# Patient Record
Sex: Male | Born: 1990 | Race: Black or African American | Hispanic: No | Marital: Single | Smoking: Current every day smoker
Health system: Southern US, Community
[De-identification: ages and names within clinical notes are randomized; demographics above are authoritative.]

---
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
## 2011-02-16 NOTE — Consult Note (Signed)
NAMEARNOLD, Corey Rivas               ACCOUNT NO.:  [id number]   MEDICAL RECORD NO.:  [id number]          PATIENT TYPE:  EMS   LOCATION:  MAJO                         FACILITY:  [facility]   PHYSICIAN:  Leonides Grills, M.D.     DATE OF BIRTH:  1991-06-26   DATE OF CONSULTATION:  06/30/3005  DATE OF DISCHARGE:                                   CONSULTATION   CHIEF COMPLAINT:  Left leg pain.   HISTORY:  This is a 20 year old young gentleman who was playing football in  his backyard when he sustained the above injury.  He was brought to the ER  by his guardians.  Once they found it was fractured, they went home to pick  up some stuff and we are trying to contact them for he does need a closed  reduction under anesthesia.   PHYSICAL EXAMINATION:  His compartments are soft in the leg and foot.  Active and passive range of motion.  There is no significant deformity.  Palpable dorsalis pedes and posterior tibial pulses.  Sensation intact to  light touch over his entire foot.   X-rays show a spiral tibial fracture with significant displacement.   IMPRESSION:  Left spiral tibial fracture.   PLAN:  I explained to [facility] that, at this point, he needs a closed  reduction under anesthesia of his left tibial fracture with a long leg cast  application.  We will proceed with this once we contact his guardians.  He  will be admitted for 23 hour observation.      Leonides Grills, M.D.  Electronically Signed     PB/MEDQ  D:  06/20/2005  T:  06/21/2005  Job:  [id number]

## 2011-02-16 NOTE — Op Note (Signed)
NAMEAHMON, TOSI               ACCOUNT NO.:  [id number]   MEDICAL RECORD NO.:  [id number]          PATIENT TYPE:  INP   LOCATION:  1827                         FACILITY:  [facility]   PHYSICIAN:  Leonides Grills, M.D.     DATE OF BIRTH:  1990/11/12   DATE OF PROCEDURE:  06/21/2005  DATE OF DISCHARGE:                                 OPERATIVE REPORT   PREOPERATIVE DIAGNOSIS:  Left closed tibia fracture.   POSTOPERATIVE DIAGNOSIS:  Left closed tibia fracture.   OPERATION PERFORMED:  Closed reduction, left closed tibia fracture with long  leg cast application under general anesthesia.   SURGEON:  Leonides Grills, M.D.   ASSISTANT:  Lianne Cure, P.A.   ANESTHESIA:  General.   DISPOSITION:  Stable to PR.   INDICATIONS FOR PROCEDURE:  The patient is a 20 year old young gentleman who  today was playing football in his back yard and sustained the above injury.  He  has consented for the above procedure by his guardians.  All risks which  include nonunion, malunion, possibility of osteotomy and repeat closed  reductions were all explained, questions were encouraged and answered.   DESCRIPTION OF PROCEDURE:  The patient was brought to the operating room and  placed in supine position after adequate general endotracheal tube  anesthesia was administered.  We then performed a closed reduction and long  leg cast application that was bivalved.  Postreduction x-rays in the AP and  lateral plane showed an anatomic reduction.  The patient was stable to the  PR.      Leonides Grills, M.D.  Electronically Signed     PB/MEDQ  D:  06/21/2005  T:  06/21/2005  Job:  [id number]

## 2011-07-02 LAB — URINE CULTURE
Colony Count: NO GROWTH
Culture: NO GROWTH

## 2011-07-02 LAB — RAPID URINE DRUG SCREEN, HOSP PERFORMED
Amphetamines: NOT DETECTED
Barbiturates: NOT DETECTED
Benzodiazepines: NOT DETECTED
Cocaine: NOT DETECTED
Opiates: NOT DETECTED
Tetrahydrocannabinol: NOT DETECTED

## 2011-07-02 LAB — URINALYSIS, ROUTINE W REFLEX MICROSCOPIC
Bilirubin Urine: NEGATIVE
Glucose, UA: NEGATIVE
Hgb urine dipstick: NEGATIVE
Ketones, ur: 15 — AB
Leukocytes, UA: NEGATIVE
Nitrite: NEGATIVE
Protein, ur: >300 — AB
Specific Gravity, Urine: 1.031 — ABNORMAL HIGH
Urobilinogen, UA: 1
pH: 6

## 2011-07-02 LAB — URINE MICROSCOPIC-ADD ON

## 2013-07-02 ENCOUNTER — Emergency Department (HOSPITAL_COMMUNITY): Payer: Self-pay

## 2013-07-02 ENCOUNTER — Encounter (HOSPITAL_COMMUNITY): Payer: Self-pay | Admitting: Emergency Medicine

## 2013-07-02 ENCOUNTER — Emergency Department (HOSPITAL_COMMUNITY)
Admission: EM | Admit: 2013-07-02 | Discharge: 2013-07-02 | Disposition: A | Discharge status: Home / Self Care | Payer: Self-pay | Attending: Emergency Medicine | Admitting: Emergency Medicine

## 2013-07-02 DIAGNOSIS — R197 Diarrhea, unspecified: Secondary | ICD-10-CM | POA: Insufficient documentation

## 2013-07-02 DIAGNOSIS — R109 Unspecified abdominal pain: Secondary | ICD-10-CM | POA: Insufficient documentation

## 2013-07-02 DIAGNOSIS — R0789 Other chest pain: Secondary | ICD-10-CM | POA: Insufficient documentation

## 2013-07-02 DIAGNOSIS — R110 Nausea: Secondary | ICD-10-CM | POA: Insufficient documentation

## 2013-07-02 DIAGNOSIS — F17200 Nicotine dependence, unspecified, uncomplicated: Secondary | ICD-10-CM | POA: Insufficient documentation

## 2013-07-02 DIAGNOSIS — R51 Headache: Secondary | ICD-10-CM | POA: Insufficient documentation

## 2013-07-02 LAB — URINALYSIS, ROUTINE W REFLEX MICROSCOPIC
Ketones, ur: NEGATIVE mg/dL
Leukocytes, UA: NEGATIVE
Nitrite: NEGATIVE
Protein, ur: NEGATIVE mg/dL
Urobilinogen, UA: 0.2 mg/dL (ref 0.0–1.0)
pH: 7 (ref 5.0–8.0)

## 2013-07-02 LAB — COMPREHENSIVE METABOLIC PANEL
AST: 15 U/L (ref 0–37)
BUN: 8 mg/dL (ref 6–23)
Calcium: 9.6 mg/dL (ref 8.4–10.5)
Creatinine, Ser: 1.03 mg/dL (ref 0.50–1.35)
GFR calc Af Amer: >90 mL/min (ref >90)
Glucose, Bld: 101 mg/dL — ABNORMAL HIGH (ref 70–99)
Potassium: 3.4 mEq/L — ABNORMAL LOW (ref 3.5–5.1)
Sodium: 136 mEq/L (ref 135–145)

## 2013-07-02 LAB — CBC WITH DIFFERENTIAL/PLATELET
Basophils Absolute: 0 10*3/uL (ref 0.0–0.1)
Eosinophils Relative: 3 % (ref 0–5)
HCT: 41.6 % (ref 39.0–52.0)
Hemoglobin: 14.2 g/dL (ref 13.0–17.0)
MCHC: 34.1 g/dL (ref 30.0–36.0)
Monocytes Absolute: 0.8 10*3/uL (ref 0.1–1.0)
RBC: 4.71 MIL/uL (ref 4.22–5.81)
RDW: 12.5 % (ref 11.5–15.5)

## 2013-07-02 LAB — RAPID HIV SCREEN (WH-MAU): Rapid HIV Screen: NONREACTIVE

## 2013-07-02 LAB — LIPASE, BLOOD: Lipase: 15 U/L (ref 11–59)

## 2013-07-02 MED ORDER — DIPHENOXYLATE-ATROPINE 2.5-0.025 MG PO TABS: 1.0000 | ORAL_TABLET | Freq: Four times a day (QID) | ORAL | Status: DC | PRN

## 2013-07-02 MED ORDER — IBUPROFEN 800 MG PO TABS: 800.0000 mg | ORAL_TABLET | Freq: Three times a day (TID) | ORAL | Status: DC

## 2013-07-02 NOTE — ED Notes (Signed)
Bed: ZO10 Expected date:  Expected time:  Means of arrival:  Comments: EMS/22 yo with headache and nausea for 2 months

## 2013-07-02 NOTE — ED Provider Notes (Signed)
CSN: [id number]     Arrival date & time 07/02/13  4098 History   First MD Initiated Contact with Patient 07/02/13 [phone]     Chief Complaint  Patient presents with  . Headache   (Consider location/radiation/quality/duration/timing/severity/associated sxs/prior Treatment) HPI Comments: Patient presents to the ER for multiple complaints. Patient reports that he has been sick for one or 2 months. He has been experiencing intermittent mild to moderate headache, sharp chest pain, crampy abdominal pain, nausea without vomiting, diarrhea. Patient reports that he stepped on a used syringe some time before all of these symptoms began. He is concerned about the 2 are related. He has not had any fevers.  Patient is a 22 y.o. male presenting with headaches.  Headache Associated symptoms: abdominal pain, diarrhea and nausea   Associated symptoms: no fever and no vomiting     History reviewed. No pertinent past medical history. History reviewed. No pertinent past surgical history. Family History  Problem Relation Age of Onset  . Diabetes Other   . Cancer Other   . CAD Other   . Stroke Other    History  Substance Use Topics  . Smoking status: Current Every Day Smoker    Types: Cigarettes  . Smokeless tobacco: Not on file  . Alcohol Use: Yes    Review of Systems  Constitutional: Negative for fever.  Cardiovascular: Positive for chest pain.  Gastrointestinal: Positive for nausea, abdominal pain and diarrhea. Negative for vomiting.  Neurological: Positive for headaches.  All other systems reviewed and are negative.    Allergies  Review of patient's allergies indicates no known allergies.  Home Medications  No current outpatient prescriptions on file. BP 140/87  Pulse 89  Temp(Src) 98.7 F (37.1 C) (Oral)  Resp 16  SpO2 100% Physical Exam  Constitutional: He is oriented to person, place, and time. He appears well-developed and well-nourished. No distress.  HENT:  Head:  Normocephalic and atraumatic.  Right Ear: Hearing normal.  Left Ear: Hearing normal.  Nose: Nose normal.  Mouth/Throat: Oropharynx is clear and moist and mucous membranes are normal.  Eyes: Conjunctivae and EOM are normal. Pupils are equal, round, and reactive to light.  Neck: Normal range of motion. Neck supple.  Cardiovascular: Regular rhythm, S1 normal and S2 normal.  Exam reveals no gallop and no friction rub.   No murmur heard. Pulmonary/Chest: Effort normal and breath sounds normal. No respiratory distress. He exhibits no tenderness.  Abdominal: Soft. Normal appearance and bowel sounds are normal. There is no hepatosplenomegaly. There is no tenderness. There is no rebound, no guarding, no tenderness at McBurney's point and negative Murphy's sign. No hernia.  Musculoskeletal: Normal range of motion.  Neurological: He is alert and oriented to person, place, and time. He has normal strength. No cranial nerve deficit or sensory deficit. Coordination normal. GCS eye subscore is 4. GCS verbal subscore is 5. GCS motor subscore is 6.  Skin: Skin is warm, dry and intact. No rash noted. No cyanosis.  Psychiatric: He has a normal mood and affect. His speech is normal and behavior is normal. Thought content normal.    ED Course  Procedures (including critical care time) Labs Review Labs Reviewed  COMPREHENSIVE METABOLIC PANEL - Abnormal; Notable for the following:    Potassium 3.4 (*)    Glucose, Bld 101 (*)    Total Bilirubin 0.2 (*)    All other components within normal limits  CBC WITH DIFFERENTIAL  LIPASE, BLOOD  RAPID HIV SCREEN ([facility])  URINALYSIS, ROUTINE  W REFLEX MICROSCOPIC   Imaging Review Dg Abd Acute W/chest  07/02/2013   CLINICAL DATA:  Persistent diarrhea.  EXAM: ACUTE ABDOMEN SERIES (ABDOMEN 2 VIEW & CHEST 1 VIEW)  COMPARISON:  None.  FINDINGS: There is no evidence of dilated bowel loops or free intraperitoneal air. No radiopaque calculi or other significant radiographic  abnormality is seen. There is stool scattered throughout the nondistended colon. Heart size and mediastinal contours are within normal limits. Both lungs are clear.  IMPRESSION: Negative abdominal radiographs.  No acute cardiopulmonary disease.   Electronically Signed   By: Geanie Cooley   On: 07/02/2013 08:03    MDM  Diagnosis: 1. Headache 2. Diarrhea  Patient presents to the ER for evaluation of multiple vague complaints. Patient is complaining of a headache but has a normal neurologic exam. This has been going on for several months. In addition to this he has been experiencing diarrhea and abdominal cramping. This is not experiencing any abdominal pain currently, as benign and nontender abdominal exam. His lab work is entirely unremarkable. Patient reports that he stepped on a needle 2 months ago. I did perform HIV testing, he was nonreactive. He has normal LFTs, no evidence of jaundice. I did discuss with him the possibility of hepatitis transmission with needle stick. Hepatitis panel has been sent. Patient will need to follow up at the wound center for results as well as further evaluation for his ongoing chronic complaints.    Gilda Crease, MD 07/02/13 [phone]

## 2013-07-02 NOTE — ED Notes (Signed)
Per EMS pt is c/o headache and nausea  Pt states he has had loose stools for the past month   Pt states he did drink last night amt unknown  Pt states he stepped on a needle about 3 mths and about a month later is when his sxs started and the pt thinks all this is related to that

## 2013-07-02 NOTE — Progress Notes (Signed)
[facility] provided pt with a [employer] Orange Card application.  °

## 2013-07-03 LAB — HEPATITIS PANEL, ACUTE
HCV Ab: NEGATIVE
Hep A IgM: NEGATIVE
Hepatitis B Surface Ag: NEGATIVE

## 2014-01-05 ENCOUNTER — Emergency Department (HOSPITAL_COMMUNITY): Payer: Self-pay

## 2014-01-05 ENCOUNTER — Encounter (HOSPITAL_COMMUNITY): Payer: Self-pay | Admitting: Emergency Medicine

## 2014-01-05 ENCOUNTER — Emergency Department (HOSPITAL_COMMUNITY)
Admission: EM | Admit: 2014-01-05 | Discharge: 2014-01-05 | Disposition: A | Discharge status: Home / Self Care | Payer: Self-pay | Attending: Emergency Medicine | Admitting: Emergency Medicine

## 2014-01-05 DIAGNOSIS — Z23 Encounter for immunization: Secondary | ICD-10-CM | POA: Insufficient documentation

## 2014-01-05 DIAGNOSIS — M2550 Pain in unspecified joint: Principal | ICD-10-CM

## 2014-01-05 DIAGNOSIS — M2559 Pain in other specified joint: Secondary | ICD-10-CM | POA: Insufficient documentation

## 2014-01-05 DIAGNOSIS — M79646 Pain in unspecified finger(s): Secondary | ICD-10-CM

## 2014-01-05 DIAGNOSIS — F17200 Nicotine dependence, unspecified, uncomplicated: Secondary | ICD-10-CM | POA: Insufficient documentation

## 2014-01-05 DIAGNOSIS — G8911 Acute pain due to trauma: Secondary | ICD-10-CM | POA: Insufficient documentation

## 2014-01-05 MED ORDER — TETANUS-DIPHTH-ACELL PERTUSSIS 5-2.5-18.5 LF-MCG/0.5 IM SUSP
0.5000 mL | Freq: Once | INTRAMUSCULAR | Status: AC
  Administered 2014-01-05: 0.5 mL via INTRAMUSCULAR
  Filled 2014-01-05: qty 0.5

## 2014-01-05 NOTE — Discharge Instructions (Signed)
Musculoskeletal Pain °Musculoskeletal pain is muscle and boney aches and pains. These pains can occur in any part of the body. Your caregiver may treat you without knowing the cause of the pain. They may treat you if blood or urine tests, X-rays, and other tests were normal.  °CAUSES °There is often not a definite cause or reason for these pains. These pains may be caused by a type of germ (virus). The discomfort may also come from overuse. Overuse includes working out too hard when your body is not fit. Boney aches also come from weather changes. Bone is sensitive to atmospheric pressure changes. °HOME CARE INSTRUCTIONS  °· Ask when your test results will be ready. Make sure you get your test results. °· Only take over-the-counter or prescription medicines for pain, discomfort, or fever as directed by your caregiver. If you were given medications for your condition, do not drive, operate machinery or power tools, or sign legal documents for 24 hours. Do not drink alcohol. Do not take sleeping pills or other medications that may interfere with treatment. °· Continue all activities unless the activities cause more pain. When the pain lessens, slowly resume normal activities. Gradually increase the intensity and duration of the activities or exercise. °· During periods of severe pain, bed rest may be helpful. Lay or sit in any position that is comfortable. °· Putting ice on the injured area. °· Put ice in a bag. °· Place a towel between your skin and the bag. °· Leave the ice on for 15 to 20 minutes, 3 to 4 times a day. °· Follow up with your caregiver for continued problems and no reason can be found for the pain. If the pain becomes worse or does not go away, it may be necessary to repeat tests or do additional testing. Your caregiver may need to look further for a possible cause. °SEEK IMMEDIATE MEDICAL CARE IF: °· You have pain that is getting worse and is not relieved by medications. °· You develop chest pain  that is associated with shortness or breath, sweating, feeling sick to your stomach (nauseous), or throw up (vomit). °· Your pain becomes localized to the abdomen. °· You develop any new symptoms that seem different or that concern you. °MAKE SURE YOU:  °· Understand these instructions. °· Will watch your condition. °· Will get help right away if you are not doing well or get worse. °Document Released: 09/17/2005 Document Revised: 12/10/2011 Document Reviewed: 05/22/2013 °Exit[employer]® Patient Information ©2014 [employer]. ° °

## 2014-01-05 NOTE — ED Notes (Signed)
Pt reports laceration to left anterior little finger two weeks ago, it has healed but still having pain to finger. No swelling or redness noted.

## 2014-01-05 NOTE — ED Provider Notes (Signed)
CSN: [id number]     Arrival date & time 01/05/14  1647 History  This chart was scribed for non-physician practitioner, Junious SilkHannah Miriya Cloer, PA-C, working with Juliet RudeNathan R. Rubin PayorPickering, MD by Smiley HousemanFallon Davis, ED Scribe. This patient was seen in room TR08C/TR08C and the patient's care was started at 6:08 PM.    Chief Complaint  Patient presents with  . Finger Injury   The history is provided by the patient. No language interpreter was used.   HPI Comments: Corey Rivas is a 23 y.o. male who presents to the Emergency Department complaining of sharp pain to right fifth finger.  Pt reports he had a laceration to his left anterior fifth finger about 2 weeks ago, but it has healed properly.  He states he received the laceration from a nail at work.  Pt states even though it has healed completely he still has pain.  Pt reports pain is worse with movement.  Pt denies numbness and tingling.  Pt denies taking anything for pain PTA.  Pt reports he is right handed.  Pt is unsure if his tetanus is UTD.  Pt states he is otherwise healthy.     History reviewed. No pertinent past medical history. History reviewed. No pertinent past surgical history. Family History  Problem Relation Age of Onset  . Diabetes Other   . Cancer Other   . CAD Other   . Stroke Other    History  Substance Use Topics  . Smoking status: Current Every Day Smoker    Types: Cigarettes  . Smokeless tobacco: Not on file  . Alcohol Use: Yes    Review of Systems  Constitutional: Negative for fever and chills.  Respiratory: Negative for shortness of breath and wheezing.   Cardiovascular: Negative for chest pain.  Gastrointestinal: Negative for nausea, vomiting and abdominal pain.  Musculoskeletal: Positive for arthralgias (left fifth finger).  Skin: Negative for color change, rash and wound.  Neurological: Negative for weakness, numbness and headaches.  Psychiatric/Behavioral: Negative for behavioral problems and confusion.  All other  systems reviewed and are negative.   Allergies  Review of patient's allergies indicates no known allergies.  Home Medications   Triage Vitals: BP 155/88  Pulse 106  Temp(Src) 98 F (36.7 C) (Oral)  Resp 18  Ht 6\' 1"  (1.854 m)  Wt 195 lb 14.4 oz (88.86 kg)  BMI 25.85 kg/m2  SpO2 95%  Physical Exam  Nursing note and vitals reviewed. Constitutional: He is oriented to person, place, and time. He appears well-developed and well-nourished. No distress.  HENT:  Head: Normocephalic and atraumatic.  Right Ear: External ear normal.  Left Ear: External ear normal.  Nose: Nose normal.  Eyes: Conjunctivae are normal.  Neck: Normal range of motion. No tracheal deviation present.  Cardiovascular: Normal rate, regular rhythm and normal heart sounds.  Exam reveals no gallop and no friction rub.   No murmur heard. Pulmonary/Chest: Effort normal and breath sounds normal. No stridor. No respiratory distress. He has no wheezes. He has no rales. He exhibits no tenderness.  Abdominal: Soft. He exhibits no distension. There is no tenderness.  Musculoskeletal: Normal range of motion.  Well healed laceration to palmer aspect of left fifth finger.  No erythema, induration, or fluctuance.  Flexion and extension tested against resistance.  5 out 5 strength.  Cap refill less than 3 seconds.  Neurovascularly intact.  Compartment soft.    Neurological: He is alert and oriented to person, place, and time.  Skin: Skin is warm and dry.  No rash noted. He is not diaphoretic.  Psychiatric: He has a normal mood and affect. His behavior is normal. Judgment and thought content normal.    ED Course  Procedures (including critical care time) DIAGNOSTIC STUDIES: Oxygen Saturation is 95% on RA, adequate by my interpretation.    COORDINATION OF CARE: 6:17 PM-Informed pt his x-ray was normal.  Will order a tdap booster.  Will discharge with finger splint.  Patient informed of current plan of treatment and evaluation  and agrees with plan.    Imaging Review Dg Finger Little Right  01/05/2014   CLINICAL DATA:  Laceration right little finger 2 weeks ago. Pain with flexion.  EXAM: RIGHT LITTLE FINGER 2+V  COMPARISON:  None.  FINDINGS: No fracture, dislocation or radiopaque foreign body is identified. No soft tissue gas collection is seen.  IMPRESSION: Negative exam.   Electronically Signed   By: Drusilla Kanner M.D.   On: 01/05/2014 17:47   MDM   Final diagnoses:  Finger pain   Patient presents to ED for evaluation of left fifth finger pain after an injury 2 weeks ago. Patient's Tdap was updated. XR shows no acute pathology. Patient has full strength of finger. Neurovascularly intact. Compartment soft. No sign of infection. Return instructions given. Vital signs stable for discharge. Patient / Family / Caregiver informed of clinical course, understand medical decision-making process, and agree with plan.   I personally performed the services described in this documentation, which was scribed in my presence. The recorded information has been reviewed and is accurate.         Mora Bellman, PA-C 01/05/14 1825

## 2014-01-06 NOTE — ED Provider Notes (Signed)
Medical screening examination/treatment/procedure(s) were performed by non-physician practitioner and as supervising physician I was immediately available for consultation/collaboration.   EKG Interpretation None       Glennette Galster R. Christle Nolting, MD 01/06/14 0023 

## 2014-01-11 ENCOUNTER — Encounter (HOSPITAL_COMMUNITY): Payer: Self-pay | Admitting: Emergency Medicine

## 2014-01-11 ENCOUNTER — Emergency Department (HOSPITAL_COMMUNITY): Payer: Self-pay

## 2014-01-11 ENCOUNTER — Emergency Department (HOSPITAL_COMMUNITY)
Admission: EM | Admit: 2014-01-11 | Discharge: 2014-01-11 | Disposition: A | Discharge status: Home / Self Care | Payer: Self-pay | Attending: Emergency Medicine | Admitting: Emergency Medicine

## 2014-01-11 DIAGNOSIS — R5383 Other fatigue: Secondary | ICD-10-CM

## 2014-01-11 DIAGNOSIS — R51 Headache: Secondary | ICD-10-CM | POA: Insufficient documentation

## 2014-01-11 DIAGNOSIS — J309 Allergic rhinitis, unspecified: Secondary | ICD-10-CM | POA: Insufficient documentation

## 2014-01-11 DIAGNOSIS — J302 Other seasonal allergic rhinitis: Secondary | ICD-10-CM

## 2014-01-11 DIAGNOSIS — R0789 Other chest pain: Secondary | ICD-10-CM | POA: Insufficient documentation

## 2014-01-11 DIAGNOSIS — M79609 Pain in unspecified limb: Secondary | ICD-10-CM | POA: Insufficient documentation

## 2014-01-11 DIAGNOSIS — R5381 Other malaise: Secondary | ICD-10-CM | POA: Insufficient documentation

## 2014-01-11 DIAGNOSIS — F17200 Nicotine dependence, unspecified, uncomplicated: Secondary | ICD-10-CM | POA: Insufficient documentation

## 2014-01-11 LAB — CBC
HEMATOCRIT: 41.8 % (ref 39.0–52.0)
HEMOGLOBIN: 14.4 g/dL (ref 13.0–17.0)
MCH: 30.8 pg (ref 26.0–34.0)
MCHC: 34.4 g/dL (ref 30.0–36.0)
MCV: 89.5 fL (ref 78.0–100.0)
Platelets: 213 10*3/uL (ref 150–400)
RBC: 4.67 MIL/uL (ref 4.22–5.81)
RDW: 12.9 % (ref 11.5–15.5)
WBC: 5.7 10*3/uL (ref 4.0–10.5)

## 2014-01-11 LAB — BASIC METABOLIC PANEL
BUN: 10 mg/dL (ref 6–23)
CALCIUM: 10 mg/dL (ref 8.4–10.5)
CHLORIDE: 101 meq/L (ref 96–112)
CO2: 29 mEq/L (ref 19–32)
CREATININE: 1.14 mg/dL (ref 0.50–1.35)
Glucose, Bld: 76 mg/dL (ref 70–99)
POTASSIUM: 4.3 meq/L (ref 3.7–5.3)
SODIUM: 140 meq/L (ref 137–147)

## 2014-01-11 LAB — I-STAT TROPONIN, ED: TROPONIN I, POC: 0 ng/mL (ref 0.00–0.08)

## 2014-01-11 MED ORDER — CETIRIZINE-PSEUDOEPHEDRINE ER 5-120 MG PO TB12: 1.0000 | ORAL_TABLET | Freq: Every day | ORAL | Status: DC

## 2014-01-11 MED ORDER — ALBUTEROL SULFATE HFA 108 (90 BASE) MCG/ACT IN AERS
1.0000 | INHALATION_SPRAY | Freq: Four times a day (QID) | RESPIRATORY_TRACT | Status: DC | PRN
  Administered 2014-01-11: 1 via RESPIRATORY_TRACT
  Filled 2014-01-11: qty 6.7

## 2014-01-11 MED ORDER — IBUPROFEN 800 MG PO TABS
800.0000 mg | ORAL_TABLET | Freq: Once | ORAL | Status: AC
  Administered 2014-01-11: 800 mg via ORAL
  Filled 2014-01-11: qty 1

## 2014-01-11 NOTE — ED Notes (Signed)
Pt reports chest pain x 3 days and also headache. Denies cough and n/v/d. Positive for fatigue and lightheaded.

## 2014-01-11 NOTE — ED Provider Notes (Signed)
CSN: [id number]     Arrival date & time 01/11/14  1201 History   First MD Initiated Contact with Patient 01/11/14 1519     Chief Complaint  Patient presents with  . Chest Pain  . Leg Pain  . Arm Pain     (Consider location/radiation/quality/duration/timing/severity/associated sxs/prior Treatment) Patient is a 23 y.o. male presenting with chest pain, leg pain, and arm pain. The history is provided by the patient.  Chest Pain Pain location:  L chest Pain quality: sharp and tightness   Pain radiates to:  Does not radiate Pain radiates to the back: no   Pain severity:  Moderate Onset quality:  Sudden Duration:  1 hour (has been intermittent for the last 3 days) Timing:  Constant (constant for 1 hour today but intermittent for the last 3 days) Progression:  Resolved Chronicity:  Recurrent Context: breathing   Relieved by:  Nothing Worsened by:  Nothing tried Associated symptoms: no abdominal pain, no altered mental status, no anorexia, no cough, no shortness of breath, not vomiting and no weakness   Associated symptoms comment:  Sneezing, headaches, fatigue Risk factors: male sex and smoking   Risk factors: no coronary artery disease, no diabetes mellitus, no high cholesterol, no hypertension and not obese   Risk factors comment:  No family hx of MI Leg Pain Arm Pain Associated symptoms include chest pain. Pertinent negatives include no abdominal pain and no shortness of breath.    History reviewed. No pertinent past medical history. History reviewed. No pertinent past surgical history. Family History  Problem Relation Age of Onset  . Diabetes Other   . Cancer Other   . CAD Other   . Stroke Other    History  Substance Use Topics  . Smoking status: Current Every Day Smoker    Types: Cigarettes  . Smokeless tobacco: Not on file  . Alcohol Use: Yes    Review of Systems  Respiratory: Negative for cough and shortness of breath.   Cardiovascular: Positive for chest pain.   Gastrointestinal: Negative for vomiting, abdominal pain and anorexia.  Neurological: Negative for weakness.  All other systems reviewed and are negative.     Allergies  Review of patient's allergies indicates no known allergies.  Home Medications  No current outpatient prescriptions on file. BP 127/77  Pulse 75  Temp(Src) 97.5 F (36.4 C) (Oral)  Resp 25  SpO2 100% Physical Exam  Nursing note and vitals reviewed. Constitutional: He is oriented to person, place, and time. He appears well-developed and well-nourished. No distress.  HENT:  Head: Normocephalic and atraumatic.  Right Ear: Tympanic membrane and ear canal normal.  Left Ear: Tympanic membrane and ear canal normal.  Nose: Mucosal edema and rhinorrhea present. Right sinus exhibits no maxillary sinus tenderness and no frontal sinus tenderness. Left sinus exhibits no maxillary sinus tenderness and no frontal sinus tenderness.  Mouth/Throat: Oropharynx is clear and moist and mucous membranes are normal.  Eyes: Conjunctivae and EOM are normal. Pupils are equal, round, and reactive to light.  Neck: Normal range of motion. Neck supple.  Cardiovascular: Normal rate, regular rhythm and intact distal pulses.   No murmur heard. Pulmonary/Chest: Effort normal and breath sounds normal. No respiratory distress. He has no wheezes. He has no rales. He exhibits no tenderness.  Abdominal: Soft. He exhibits no distension. There is no tenderness. There is no rebound and no guarding.  Musculoskeletal: Normal range of motion. He exhibits no edema and no tenderness.  Neurological: He is alert and  oriented to person, place, and time.  Skin: Skin is warm and dry. No rash noted. No erythema.  Psychiatric: He has a normal mood and affect. His behavior is normal.    ED Course  Procedures (including critical care time) Labs Review Labs Reviewed  CBC  BASIC METABOLIC PANEL  Rosezena SensorI-STAT TROPOININ, ED   Imaging Review Dg Chest 2  View  01/11/2014   CLINICAL DATA:  Chest pain and cough.  EXAM: CHEST - 2 VIEW  COMPARISON:  DG ABD ACUTE W/CHEST dated 07/02/2013  FINDINGS: The heart size and mediastinal contours are within normal limits. There is no evidence of pulmonary edema, consolidation, pneumothorax, nodule or pleural fluid. The visualized skeletal structures are unremarkable.  IMPRESSION: No active disease.   Electronically Signed   By: Irish LackGlenn  Yamagata M.D.   On: 01/11/2014 16:34     EKG Interpretation   Date/Time:  Monday January 11 2014 12:07:18 EDT Ventricular Rate:  94 PR Interval:  140 QRS Duration: 82 QT Interval:  346 QTC Calculation: 432 R Axis:   84 Text Interpretation:  Normal sinus rhythm with sinus arrhythmia Normal ECG  No previous tracing Confirmed by Anitra LauthPLUNKETT  MD, Alphonzo LemmingsWHITNEY ([id number]) on  01/11/2014 3:19:12 PM      MDM   Final diagnoses:  Seasonal allergies  Chest pain, atypical    Patient he period of symptoms including headache, fatigue, sneezing, and intermittent chest pain that has been going on for about one month. He states this occurs almost every year around this time.  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  Pain has been intermittent in the chest over the last 3 days but is at rest and exertional.  He has no heart risk factors except for smoking and is PERC neg.  Pt is well appearing and currently not having CP.   His EKG is wnl and Labs are all wnl.  CXR wnl.   Troponin was checked appx 3 hours after chest pain and was neg and with Heart score of 1 only for smoking feel pt has been adequately ruled out for cardiac cause of sx.  Denies any infectious sx concerning for PNA and low suspicion for PE.  Feel pt's sx are related to allergies.  Will treat with albuterol and zyrtec.  5:05 PM CXR wnl.  Will d/c home.  Gwyneth SproutWhitney Amram Maya, MD 01/11/14 [phone]

## 2014-01-11 NOTE — Discharge Instructions (Signed)
Allergies  Allergies may happen from anything your body is sensitive to. This may be food, medicines, pollens, chemicals, and many other things. Food allergies can be severe and deadly.  HOME CARE  If you do not know what causes a reaction, keep a diary. Write down the foods you ate and the symptoms that followed. Avoid foods that cause reactions.  If you have red raised spots (hives) or a rash:  Take medicine as told by your doctor.  Use medicines for red raised spots and itching as needed.  Apply cold cloths (compresses) to the skin. Take a cool bath. Avoid hot baths or showers.  If you are severely allergic:  It is often necessary to go to the hospital after you have treated your reaction.  Wear your medical alert jewelry.  You and your family must learn how to give a allergy shot or use an allergy kit (anaphylaxis kit).  Always carry your allergy kit or shot with you. Use this medicine as told by your doctor if a severe reaction is occurring. GET HELP RIGHT AWAY IF:  You have trouble breathing or are making high-pitched whistling sounds (wheezing).  You have a tight feeling in your chest or throat.  You have a puffy (swollen) mouth.  You have red raised spots, puffiness (swelling), or itching all over your body.  You have had a severe reaction that was helped by your allergy kit or shot. The reaction can return once the medicine has worn off.  You think you are having a food allergy. Symptoms most often happen within 30 minutes of eating a food.  Your symptoms have not gone away within 2 days or are getting worse.  You have new symptoms.  You want to retest yourself with a food or drink you think causes an allergic reaction. Only do this under the care of a doctor. MAKE SURE YOU:   Understand these instructions.  Will watch your condition.  Will get help right away if you are not doing well or get worse. Document Released: 01/12/2013 Document Reviewed:  01/12/2013 [facility] Patient Information 2014 [place], [place].  Chest Pain (Nonspecific) It is often hard to give a specific diagnosis for the cause of chest pain. There is always a chance that your pain could be related to something serious, such as a heart attack or a blood clot in the lungs. You need to follow up with your caregiver for further evaluation. CAUSES   Heartburn.  Pneumonia or bronchitis.  Anxiety or stress.  Inflammation around your heart (pericarditis) or lung (pleuritis or pleurisy).  A blood clot in the lung.  A collapsed lung (pneumothorax). It can develop suddenly on its own (spontaneous pneumothorax) or from injury (trauma) to the chest.  Shingles infection (herpes zoster virus). The chest wall is composed of bones, muscles, and cartilage. Any of these can be the source of the pain.  The bones can be bruised by injury.  The muscles or cartilage can be strained by coughing or overwork.  The cartilage can be affected by inflammation and become sore (costochondritis). DIAGNOSIS  Lab tests or other studies, such as X-rays, electrocardiography, stress testing, or cardiac imaging, may be needed to find the cause of your pain.  TREATMENT   Treatment depends on what may be causing your chest pain. Treatment may include:  Acid blockers for heartburn.  Anti-inflammatory medicine.  Pain medicine for inflammatory conditions.  Antibiotics if an infection is present.  You may be advised to change lifestyle habits.  This includes stopping smoking and avoiding alcohol, caffeine, and chocolate.  You may be advised to keep your head raised (elevated) when sleeping. This reduces the chance of acid going backward from your stomach into your esophagus.  Most of the time, nonspecific chest pain will improve within 2 to 3 days with rest and mild pain medicine. HOME CARE INSTRUCTIONS   If antibiotics were prescribed, take your antibiotics as directed. Finish them even if  you start to feel better.  For the next few days, avoid physical activities that bring on chest pain. Continue physical activities as directed.  Do not smoke.  Avoid drinking alcohol.  Only take over-the-counter or prescription medicine for pain, discomfort, or fever as directed by your caregiver.  Follow your caregiver's suggestions for further testing if your chest pain does not go away.  Keep any follow-up appointments you made. If you do not go to an appointment, you could develop lasting (chronic) problems with pain. If there is any problem keeping an appointment, you must call to reschedule. SEEK MEDICAL CARE IF:   You think you are having problems from the medicine you are taking. Read your medicine instructions carefully.  Your chest pain does not go away, even after treatment.  You develop a rash with blisters on your chest. SEEK IMMEDIATE MEDICAL CARE IF:   You have increased chest pain or pain that spreads to your arm, neck, jaw, back, or abdomen.  You develop shortness of breath, an increasing cough, or you are coughing up blood.  You have severe back or abdominal pain, feel nauseous, or vomit.  You develop severe weakness, fainting, or chills.  You have a fever. THIS IS AN EMERGENCY. Do not wait to see if the pain will go away. Get medical help at once. Call your local emergency services (911 in U.S.). Do not drive yourself to the hospital. MAKE SURE YOU:   Understand these instructions.  Will watch your condition.  Will get help right away if you are not doing well or get worse. Document Released: 06/27/2005 Document Revised: 12/10/2011 Document Reviewed: 04/22/2008 [facility] Patient Information 2014 [place].

## 2014-01-11 NOTE — ED Notes (Addendum)
Per patient-cut right pinky finger with rusty nail one week ago. Received tetanus shot. Since then reports constant headache and occasional chest pain with no relief. Finger still causing pain. No c/o chest pain at this time. Also complains left arm is "cramping". Both arms are warm dry and not swollen with 2+ pulses. Resting comfortably. MD in to see patient.

## 2014-01-11 NOTE — Progress Notes (Signed)
[facility] provided pt with a list of primary care resources, highlighting [facility].  °

## 2014-01-16 ENCOUNTER — Encounter (HOSPITAL_COMMUNITY): Payer: Self-pay | Admitting: Emergency Medicine

## 2014-01-16 ENCOUNTER — Emergency Department (HOSPITAL_COMMUNITY): Payer: Self-pay

## 2014-01-16 ENCOUNTER — Emergency Department (HOSPITAL_COMMUNITY)
Admission: EM | Admit: 2014-01-16 | Discharge: 2014-01-16 | Disposition: A | Discharge status: Home / Self Care | Payer: Self-pay | Attending: Emergency Medicine | Admitting: Emergency Medicine

## 2014-01-16 DIAGNOSIS — Z79899 Other long term (current) drug therapy: Secondary | ICD-10-CM | POA: Insufficient documentation

## 2014-01-16 DIAGNOSIS — F17200 Nicotine dependence, unspecified, uncomplicated: Secondary | ICD-10-CM | POA: Insufficient documentation

## 2014-01-16 DIAGNOSIS — R1032 Left lower quadrant pain: Secondary | ICD-10-CM

## 2014-01-16 DIAGNOSIS — R109 Unspecified abdominal pain: Secondary | ICD-10-CM | POA: Insufficient documentation

## 2014-01-16 DIAGNOSIS — R51 Headache: Secondary | ICD-10-CM | POA: Insufficient documentation

## 2014-01-16 DIAGNOSIS — N489 Disorder of penis, unspecified: Secondary | ICD-10-CM | POA: Insufficient documentation

## 2014-01-16 LAB — URINALYSIS, ROUTINE W REFLEX MICROSCOPIC
Bilirubin Urine: NEGATIVE
Glucose, UA: NEGATIVE mg/dL
Hgb urine dipstick: NEGATIVE
Ketones, ur: NEGATIVE mg/dL
Leukocytes, UA: NEGATIVE
NITRITE: NEGATIVE
Protein, ur: NEGATIVE mg/dL
SPECIFIC GRAVITY, URINE: 1.019 (ref 1.005–1.030)
Urobilinogen, UA: 1 mg/dL (ref 0.0–1.0)
pH: 7 (ref 5.0–8.0)

## 2014-01-16 MED ORDER — IBUPROFEN 800 MG PO TABS: 800.0000 mg | ORAL_TABLET | Freq: Three times a day (TID) | ORAL | Status: DC

## 2014-01-16 NOTE — Discharge Instructions (Signed)
Your groin pain may be related to a hernia.  If you develop swelling, worsening pain or if you have any concerns, please return to ER for further care.    Hernia A hernia occurs when an internal organ pushes out through a weak spot in the abdominal wall. Hernias most commonly occur in the groin and around the navel. Hernias often can be pushed back into place (reduced). Most hernias tend to get worse over time. Some abdominal hernias can get stuck in the opening (irreducible or incarcerated hernia) and cannot be reduced. An irreducible abdominal hernia which is tightly squeezed into the opening is at risk for impaired blood supply (strangulated hernia). A strangulated hernia is a medical emergency. Because of the risk for an irreducible or strangulated hernia, surgery may be recommended to repair a hernia. CAUSES   Heavy lifting.  Prolonged coughing.  Straining to have a bowel movement.  A cut (incision) made during an abdominal surgery. HOME CARE INSTRUCTIONS   Bed rest is not required. You may continue your normal activities.  Avoid lifting more than 10 pounds (4.5 kg) or straining.  Cough gently. If you are a smoker it is best to stop. Even the best hernia repair can break down with the continual strain of coughing. Even if you do not have your hernia repaired, a cough will continue to aggravate the problem.  Do not wear anything tight over your hernia. Do not try to keep it in with an outside bandage or truss. These can damage abdominal contents if they are trapped within the hernia sac.  Eat a normal diet.  Avoid constipation. Straining over long periods of time will increase hernia size and encourage breakdown of repairs. If you cannot do this with diet alone, stool softeners may be used. SEEK IMMEDIATE MEDICAL CARE IF:   You have a fever.  You develop increasing abdominal pain.  You feel nauseous or vomit.  Your hernia is stuck outside the abdomen, looks discolored, feels  hard, or is tender.  You have any changes in your bowel habits or in the hernia that are unusual for you.  You have increased pain or swelling around the hernia.  You cannot push the hernia back in place by applying gentle pressure while lying down. MAKE SURE YOU:   Understand these instructions.  Will watch your condition.  Will get help right away if you are not doing well or get worse. Document Released: 09/17/2005 Document Revised: 12/10/2011 Document Reviewed: 05/06/2008 [facility] Patient Information 2014 [place].   Emergency Department Resource Guide 1) Find a Doctor and Pay Out of Pocket Although you won't have to find out who is covered by your insurance plan, it is a good idea to ask around and get recommendations. You will then need to call the office and see if the doctor you have chosen will accept you as a new patient and what types of options they offer for patients who are self-pay. Some doctors offer discounts or will set up payment plans for their patients who do not have insurance, but you will need to ask so you aren't surprised when you get to your appointment.  2) Contact Your Local Health Department Not all health departments have doctors that can see patients for sick visits, but many do, so it is worth a call to see if yours does. If you don't know where your local health department is, you can check in your phone book. The CDC also has a tool to help  you locate your state's health department, and many state websites also have listings of all of their local health departments.  3) Find a Walk-in Clinic If your illness is not likely to be very severe or complicated, you may want to try a walk in clinic. These are popping up all over the country in pharmacies, drugstores, and shopping centers. They're usually staffed by nurse practitioners or physician assistants that have been trained to treat common illnesses and complaints. They're usually fairly quick and  inexpensive. However, if you have serious medical issues or chronic medical problems, these are probably not your best option.  No Primary Care Doctor: - Call Health Connect at  [phone] - they can help you locate a primary care doctor that  accepts your insurance, provides certain services, etc. - Physician Referral Service- [phone]  Chronic Pain Problems: Organization         Address  Phone   Notes  [facility]  [phone] Patients need to be referred by their primary care doctor.   Medication Assistance: Organization         Address  Phone   Notes  [facility] [address] [place], [place] [id number] [phone] --Must be a resident of [facility] -- Must have NO insurance coverage whatsoever (no Medicaid/ Medicare, etc.) -- The pt. MUST have a primary care doctor that directs their care regularly and follows them in the community   [facility]ist  [phone]   [employer]  [phone]    Agencies that provide inexpensive medical care: Organization         Address  Phone   Notes  [facility]  [phone]   [facility]    [phone]) [phone]   [facility] [address] [place], [place] [address] [phone]   [facility] [address], [place] [phone]   [facility]    [phone]   [facility]    [phone]   [facility]  [address], [place] Phone:  [phone], Fax:  [phone] Hours of Operation:  9 am - 6 pm, M-F.  Also accepts Medicaid/Medicare and self-pay.  [facility]  [address], [place] Phone: [phone]) [phone], Fax: [phone]. Hours of Operation:  8:30 am - 5:30 pm, M-F.  Also accepts Medicaid and self-pay.  [facility] [place] [address], [place] Phone: [phone]   [facility] [address], [place], [place] [phone], Ext. [phone] Mondays & Thursdays: 7-9 AM.  First 15 patients are seen on a first come, first serve basis.    Medicaid-accepting [facility] Providers:  Organization         Address  Phone   Notes  [facility] [address], [place] [phone] Also accepts self-pay patients.  [facility] [address] [place], [place]  [phone]   [facility] [address], [place] [phone]   [facility] [address] [place] [address], [place] [phone]   Renaye RakersVeita Bland [address], [place]   [phone] Only accepts [place] Access [place] patients after they have their name applied to their card.   Self-Pay (no insurance) in [facility] [place]:  Organization  Address  Phone   Notes  Sickle Cell Patients, [facility] [place] [phone]   [facility] [place] [phone]   [facility]  [place], [address], [place] [phone]   [facility]/Dr. Osei-Bonsu  [address] [place] or [address], [place] [phone] Phone number for both [place] and [place] locations is the same.  [facility] [address] [place] [phone]   [facility] [address], [place] or [address] [phone]   [facility] [address], [place] [phone], phone; [phone], fax Sees patients 1st and 3rd Saturday of every month.  Must not qualify for public or private insurance (i.e. Medicaid, Medicare, [place] Health Choice, Veterans' Benefits)  Household income should be no more than 200% of the poverty level The clinic cannot treat you if you are pregnant or  think you are pregnant  Sexually transmitted diseases are not treated at the clinic.    Dental Care: Organization         Address  Phone  Notes  [facility] [place] [phone] Accepts children up to age 3 who are enrolled in [place] or [place]; pregnant women with a Medicaid card; and children who have applied for Medicaid or [place] Health Choice, but were declined, whose parents can pay a reduced fee at time of service.  [facility]  [address], [place] [phone] Accepts children up to age 81 who are enrolled in [place] or [place]; pregnant women with a Medicaid card; and children who have applied for Medicaid or [place] Health Choice, but were declined, whose parents can pay a reduced fee at time of service.  [facility]  [place] [phone] Patients are seen by appointment only. Walk-ins are not accepted. [place] will see patients 82 years of age and older. Monday - Tuesday (8am-5pm) Most Wednesdays (8:30-5pm) $30 per visit, cash only  [facility]  [address], [facility] [phone] Patients are seen by appointment only. Walk-ins are not accepted. [place] will see patients 17 years of age and older. One Wednesday Evening (Monthly: Volunteer Based).  $30 per visit, cash only  [place]  [phone] for adults; Children under age 57, call Graduate Pediatric Dentistry at [phone]. Children aged 44-14, please call [phone] to request a pediatric application.  Dental services are provided in all areas of dental care including fillings, crowns and bridges, complete and partial dentures, implants, gum treatment, root canals, and extractions. Preventive care is also provided. Treatment is provided to both adults  and children. Patients are selected via a lottery and there is often a waiting list.   [facility] [address] [place]  [phone] [email or web address]   [facility] [address] [place], [place] [phone], Ext. [phone] Second and Fourth Thursday of each month, opens at 6:30 AM; Clinic ends at 9 AM.  Patients are seen on a first-come first-served basis, and a limited number are seen during each clinic.   [facility]  [address] [place], [place], [place] [phone]   Eligibility Requirements °You must have lived in [place], [place], or [place] counties for at least the last three months. °  You cannot be eligible for state or federal sponsored healthcare insurance, including Veterans Administration, Medicaid, or Medicare. °  You generally cannot be eligible for healthcare insurance through your employer.  °  How to apply: °Eligibility screenings are held every Tuesday and Wednesday afternoon from 1:00 pm until 4:00 pm. You do not need an appointment for the interview!  °[facility] [address], [place], [place] [phone]   °[facility]  336-342-8273   °[facility]  336-703-3100   °[facility]  336-570-6415   ° °Behavioral Health Resources in the Community: °Intensive Outpatient Programs °Organization         Address  Phone  Notes  °[place] [facility] [address], [place], [place] [phone]   °[facility] [address], [place], [place] [phone]   °[facility] [address], [place], [place] ° 336-882-2125   °[facility] [address],  °[place], [place] 1-800-853-5163 or 336-641-4981   °Substance Abuse Resources °Organization         Address  Phone  Notes  °[facility]  336-882-2125   °[facility]  336-784-9470   °[facility]  336-285-9073     °[facility]  336-845-3988   °Residential & Outpatient Substance Abuse Program  1-800-659-3381   °Psychological Services °Organization         Address  Phone  Notes  °[facility]  336- [phone]   °[facility]  336- 378-7881   °[facility] [address], [place] 1-800-853-5163 or 336-641-4981   ° °Mobile Crisis Teams °Organization         Address  Phone  Notes  °[facility], Mobile Crisis Care Unit  1-877-626-1772   °Assertive °Psychotherapeutic Services ° [address] [place], [place] 336-834-9664   °Sharon DeEsch [address] °[place] 336-554-5454   ° °Self-Help/Support Groups °Organization         Address  Phone             Notes  °[facility] [place] - variety of support groups  336- 373-1402 Call for more information  °Narcotics Anonymous (NA), [facility] [address], °[place]  2 meetings at this location  ° °Residential Treatment Programs °Organization         Address  Phone  Notes  °[facility] [address],    °[place]  1-866-801-8205   °[facility] ° [address], [place], [place] [phone]   °[facility] [address], [place] 336-845-3988 Admissions: 8am-3pm M-F  °[facility] [address]    °[place], [place] 336-841-1104   °[facility] [address], [place], [place] 336-379-7146   °[facility] [address]  °[place], [place] 336-285-9073   °[facility] - Intensive Outpatient [address], [place], [place] 336-852-3033   °[facility] ([facility] [address]  °[place], [place] 1-877-615-2722 or 336-784-9470   °[facility] ([facility]) [address] [place], [place] 336-227-7417 Accepts Medicaid  °[facility] [address]  °[place] 1-800-659-3381 Substance Abuse/Addiction Treatment  ° °[place]  Behavioral Health Resources °  Organization         Address  Phone  Notes  °[facility]  (888) 581-9988   °Julie Brannon, PhD [address] [place], [place]   (336) 349-5553 or (336) 951-0000   °[facility]   [address] °[place], [place] (336) 349-4454   °[facility] [address], [place], [place] (336) 342-8316 Insurance/Medicaid/sponsorship through [facility]  °[facility] [address]                                    [place], [place] (336) 342-8316 Therapy/tele-psych/case  °[facility] [address]  ° [place], [place] (336) 349-2233    °Dr. Arfeen  (336) 349-4544   °[facility] [place]  [employer] [place] [facility] 1) [address],  °2) [address], [place] °3)  [address] [place] [address], [place] (336) 349-3220 °(336) 342-7768 ° °(336) 342-8140   °[place] Child Abuse Hotline (336) 342-1394 or (336) 342-3537 (After Hours)    ° ° ° °

## 2014-01-16 NOTE — ED Notes (Signed)
He is undergoing u/s as I write this.

## 2014-01-16 NOTE — ED Provider Notes (Signed)
CSN: [id number]     Arrival date & time 01/16/14  1315 History  This chart was scribed for non-physician practitioner, Fayrene HelperBowie Charlesa Ehle, PA-C,working with Rolan BuccoMelanie Belfi, MD, by Karle PlumberJennifer Tensley, ED Scribe.  This patient was seen in room WA24/WA24 and the patient's care was started at 2:09 PM.  Chief Complaint  Patient presents with  . Testicle Pain   The history is provided by the patient. No language interpreter was used.   HPI Comments:  Corey Rivas is a 23 y.o. male who presents to the Emergency Department complaining of severe aching left groin and testicle pain and tingling that started about over one year ago. Pt states the pain has been worsening and lasts for hours at a time. He states the pain is primarily in the left testicle but it sometimes radiates to the right. He report hardness of the testicles and tingling to the tip of the scrotum. He reports the testicle is severely tender to touch. Pt is unsure of penile discharge. He reports an associated daily HA for the past year. Pt denies taking anything for pain. He states he does a lot of heavy lifting because of his job. He reports an unassociated, itching hemorrhoid for the last 4-5 days that is unchanged and not bleeding. He does not have a PCP. He denies swelling of the groin or penis, difficulty urinating, dysuria, fever and chills. Pt expresses concern and worry of testicular cancer, epididymitis, or other concerning medical conditions.  History reviewed. No pertinent past medical history. History reviewed. No pertinent past surgical history. Family History  Problem Relation Age of Onset  . Diabetes Other   . Cancer Other   . CAD Other   . Stroke Other    History  Substance Use Topics  . Smoking status: Current Every Day Smoker    Types: Cigarettes  . Smokeless tobacco: Not on file  . Alcohol Use: Yes    Review of Systems  Constitutional: Negative for fever and chills.  Genitourinary: Positive for penile pain (tingling) and  testicular pain. Negative for dysuria, penile swelling, scrotal swelling and difficulty urinating.  Neurological: Positive for headaches.    Allergies  Review of patient's allergies indicates no known allergies.  Home Medications   Prior to Admission medications   Medication Sig Start Date End Date Taking? Authorizing Provider  albuterol (PROVENTIL HFA;VENTOLIN HFA) 108 (90 BASE) MCG/ACT inhaler Inhale 1 puff into the lungs every 6 (six) hours as needed for wheezing or shortness of breath.   Yes Historical Provider, MD  cetirizine-pseudoephedrine (ZYRTEC-D) 5-120 MG per tablet Take 1 tablet by mouth daily. 01/11/14   Gwyneth SproutWhitney Plunkett, MD   Triage Vitals: BP 132/90  Pulse 90  Temp(Src) 98.9 F (37.2 C) (Oral)  Resp 16  SpO2 100% Physical Exam  Nursing note and vitals reviewed. Constitutional: He is oriented to person, place, and time. He appears well-developed and well-nourished.  HENT:  Head: Normocephalic and atraumatic.  Eyes: EOM are normal.  Neck: Normal range of motion.  Cardiovascular: Normal rate, regular rhythm and normal heart sounds.  Exam reveals no gallop and no friction rub.   No murmur heard. Pulmonary/Chest: Effort normal and breath sounds normal. No respiratory distress. He has no wheezes. He has no rales.  Abdominal: Soft. Hernia confirmed negative in the right inguinal area and confirmed negative in the left inguinal area.  Genitourinary: Testes normal and penis normal. Cremasteric reflex is present. Right testis shows no tenderness. Left testis shows no tenderness. Circumcised.  Bilateral testicle  with normal lies. Normal scrotum. No rash or swelling.  Musculoskeletal: Normal range of motion.  Lymphadenopathy:       Right: No inguinal adenopathy present.       Left: No inguinal adenopathy present.  Neurological: He is alert and oriented to person, place, and time.  Skin: Skin is warm and dry.  Psychiatric: He has a normal mood and affect. His behavior is  normal.    ED Course  Procedures (including critical care time) DIAGNOSTIC STUDIES: Oxygen Saturation is 100% on RA, normal by my interpretation.   COORDINATION OF CARE: 2:20 PM- Will perform ultrasound of testicles. Pt verbalizes understanding and agrees to plan.  3:43 PM Given that pt was concern of epidydimitis and cancern, [place] ordered and result was negative.  Reassurance given. Ibuprofen for pain, resources provide and close f/u instruction given.  I suspect pt my have risk of developing inguinal hernia from his heavy strenuous job.  CCS referral given.   Medications - No data to display  Labs Review Labs Reviewed  URINALYSIS, ROUTINE W REFLEX MICROSCOPIC    Imaging Review US Scrotum  01/16/2014   CLINICAL DATA:  Left-sided testicular pain.  EXAM: SCROTAL ULTRASOUND  DOPPLER ULTRASOUND OF THE TESTICLES  TECHNIQUE: Complete ultrasound examination of the testicles, epididymis, and other scrotal structures was performed. Color and spectral Doppler ultrasound were also utilized to evaluate blood flow to the testicles.  COMPARISON:  None.  FINDINGS: Right testicle  Measurements: 3.8 x 1.9 x 2.8 cm. Normal in morphology. Normal color Doppler signal.  Left testicle  Measurements: 4.2 x 2.0 x 3.2 cm. Normal in morphology. Normal color Doppler signal.  Right epididymis:  Normal in size and appearance.  Left epididymis:  Normal in size and appearance.  Hydrocele: Small volume scrotal fluid bilaterally. This is likely physiologic.  Varicocele:  Absent  Pulsed Doppler interrogation of both testes demonstrates low resistance arterial and venous waveforms bilaterally.  IMPRESSION: No evidence of testicular torsion, orchitis/ epididymitis, or other explanation for left-sided pain.   Electronically Signed   By: Jeronimo Greaves M.D.   On: 01/16/2014 15:21   [place] Art/ven Flow Abd Pelv Doppler  01/16/2014   CLINICAL DATA:  Left-sided testicular pain.  EXAM: SCROTAL ULTRASOUND  DOPPLER ULTRASOUND OF THE  TESTICLES  TECHNIQUE: Complete ultrasound examination of the testicles, epididymis, and other scrotal structures was performed. Color and spectral Doppler ultrasound were also utilized to evaluate blood flow to the testicles.  COMPARISON:  None.  FINDINGS: Right testicle  Measurements: 3.8 x 1.9 x 2.8 cm. Normal in morphology. Normal color Doppler signal.  Left testicle  Measurements: 4.2 x 2.0 x 3.2 cm. Normal in morphology. Normal color Doppler signal.  Right epididymis:  Normal in size and appearance.  Left epididymis:  Normal in size and appearance.  Hydrocele: Small volume scrotal fluid bilaterally. This is likely physiologic.  Varicocele:  Absent  Pulsed Doppler interrogation of both testes demonstrates low resistance arterial and venous waveforms bilaterally.  IMPRESSION: No evidence of testicular torsion, orchitis/ epididymitis, or other explanation for left-sided pain.   Electronically Signed   By: Jeronimo Greaves M.D.   On: 01/16/2014 15:21     EKG Interpretation None      MDM   Final diagnoses:  Left groin pain    BP 132/90  Pulse 90  Temp(Src) 98.9 F (37.2 C) (Oral)  Resp 16  SpO2 100%  I have reviewed nursing notes and vital signs. I personally reviewed the imaging tests through PACS system  I reviewed available ER/hospitalization records thought the EMR   I personally performed the services described in this documentation, which was scribed in my presence. The recorded information has been reviewed and is accurate.    Fayrene HelperBowie Mansur Patti, PA-C 01/16/14 [phone]

## 2014-01-16 NOTE — ED Notes (Signed)
Pt states that he has been having lt sided testicular pain x 3 wks.  States that it is not swollen but it is hard.  Denies dysuria.

## 2014-01-17 NOTE — ED Provider Notes (Signed)
Medical screening examination/treatment/procedure(s) were performed by non-physician practitioner and as supervising physician I was immediately available for consultation/collaboration.   EKG Interpretation None        Aubriee Szeto W. Clemon Devaul, MD 01/17/14 1534 

## 2014-12-08 ENCOUNTER — Emergency Department (HOSPITAL_COMMUNITY): Payer: Self-pay

## 2014-12-08 ENCOUNTER — Emergency Department (HOSPITAL_COMMUNITY)
Admission: EM | Admit: 2014-12-08 | Discharge: 2014-12-08 | Disposition: A | Discharge status: Home / Self Care | Payer: Self-pay | Attending: Emergency Medicine | Admitting: Emergency Medicine

## 2014-12-08 ENCOUNTER — Encounter (HOSPITAL_COMMUNITY): Payer: Self-pay | Admitting: Nurse Practitioner

## 2014-12-08 DIAGNOSIS — S99922A Unspecified injury of left foot, initial encounter: Secondary | ICD-10-CM

## 2014-12-08 DIAGNOSIS — S90512A Abrasion, left ankle, initial encounter: Secondary | ICD-10-CM | POA: Insufficient documentation

## 2014-12-08 DIAGNOSIS — Y939 Activity, unspecified: Secondary | ICD-10-CM | POA: Insufficient documentation

## 2014-12-08 DIAGNOSIS — W208XXA Other cause of strike by thrown, projected or falling object, initial encounter: Secondary | ICD-10-CM | POA: Insufficient documentation

## 2014-12-08 DIAGNOSIS — Z79899 Other long term (current) drug therapy: Secondary | ICD-10-CM | POA: Insufficient documentation

## 2014-12-08 DIAGNOSIS — Y999 Unspecified external cause status: Secondary | ICD-10-CM | POA: Insufficient documentation

## 2014-12-08 DIAGNOSIS — Z720 Tobacco use: Secondary | ICD-10-CM | POA: Insufficient documentation

## 2014-12-08 DIAGNOSIS — Y929 Unspecified place or not applicable: Secondary | ICD-10-CM | POA: Insufficient documentation

## 2014-12-08 MED ORDER — HYDROCODONE-ACETAMINOPHEN 5-325 MG PO TABS
1.0000 | ORAL_TABLET | Freq: Once | ORAL | Status: AC
  Administered 2014-12-08: 1 via ORAL
  Filled 2014-12-08: qty 1

## 2014-12-08 NOTE — ED Notes (Addendum)
Pt reports a trailer fell onto his L foot this afternoon. hes had pain and swelling in the foot since. He took ibuprofen with minimal relief of pain

## 2014-12-08 NOTE — Discharge Instructions (Signed)
Return to the emergency room with worsening of symptoms, new symptoms or with symptoms that are concerning, especially fevers, numbness, tingling, increased swelling, weakness, redness, unable to move foot. Crutches for comfort. He does not need to use after 24-48 hours. RICE: Rest, Ice (three cycles of 20 mins on, 20mins off at least twice a day), compression/brace, elevation. Heating pad works well for back pain. Ibuprofen 400mg  (2 tablets 200mg ) every 5-6 hours for 3-5 days. Follow up with PCP if symptoms worsen or are persistent. Read below information and follow recommendations. Crush Injury, Fingers or Toes A crush injury to the fingers or toes means the tissues have been damaged by being squeezed (compressed). There will be bleeding into the tissues and swelling. Often, blood will collect under the skin. When this happens, the skin on the finger often dies and may slough off (shed) 1 week to 10 days later. Usually, new skin is growing underneath. If the injury has been too severe and the tissue does not survive, the damaged tissue may begin to turn black over several days.  Wounds which occur because of the crushing may be stitched (sutured) shut. However, crush injuries are more likely to become infected than other injuries.These wounds may not be closed as tightly as other types of cuts to prevent infection. Nails involved are often lost. These usually grow back over several weeks.  DIAGNOSIS X-rays may be taken to see if there is any injury to the bones. TREATMENT Broken bones (fractures) may be treated with splinting, depending on the fracture. Often, no treatment is required for fractures of the last bone in the fingers or toes. HOME CARE INSTRUCTIONS   The crushed part should be raised (elevated) above the heart or center of the chest as much as possible for the first several days or as directed. This helps with pain and lessens swelling. Less swelling increases the chances that the  crushed part will survive.  Put ice on the injured area.  Put ice in a plastic bag.  Place a towel between your skin and the bag.  Leave the ice on for 15-20 minutes, 03-04 times a day for the first 2 days.  Only take over-the-counter or prescription medicines for pain, discomfort, or fever as directed by your caregiver.  Use your injured part only as directed.  Change your bandages (dressings) as directed.  Keep all follow-up appointments as directed by your caregiver. Not keeping your appointment could result in a chronic or permanent injury, pain, and disability. If there is any problem keeping the appointment, you must call to reschedule. SEEK IMMEDIATE MEDICAL CARE IF:   There is redness, swelling, or increasing pain in the wound area.  Pus is coming from the wound.  You have a fever.  You notice a bad smell coming from the wound or dressing.  The edges of the wound do not stay together after the sutures have been removed.  You are unable to move the injured finger or toe. MAKE SURE YOU:   Understand these instructions.  Will watch your condition.  Will get help right away if you are not doing well or get worse. Document Released: 09/17/2005 Document Revised: 12/10/2011 Document Reviewed: 02/02/2011 [facility] Patient Information 2015 [employer] [place]. This information is not intended to replace advice given to you by your health care provider. Make sure you discuss any questions you have with your health care provider.

## 2014-12-08 NOTE — Progress Notes (Signed)
Orthopedic Tech Progress Note Patient Details:  Corey RumpMarquel Rivas 07/10/1991 [id number]  Ortho Devices Type of Ortho Device: Crutches Ortho Device/Splint Interventions: Application   Haskell Flirtewsome, Quadarius Henton M 12/08/2014, 11:32 PM

## 2014-12-08 NOTE — ED Notes (Signed)
Pt made aware to return if symptoms worsen or if any life threatening symptoms occur.   

## 2014-12-08 NOTE — ED Notes (Signed)
Ice provided to pt for foot per PA

## 2014-12-08 NOTE — ED Provider Notes (Signed)
CSN: [id number]     Arrival date & time 12/08/14  2124 History  This chart was scribed for non-physician practitioner working with Mirian MoMatthew Gentry, MD by Richarda Overlieichard Holland, ED Scribe. This patient was seen in room TR04C/TR04C and the patient's care was started at 10:17 PM.   Chief Complaint  Patient presents with  . Foot Injury   The history is provided by the patient. No language interpreter was used.   HPI Comments: Corey RumpMarquel Rivas is a 24 y.o. male who presents to the Emergency Department complaining of a foot injury that occurred around 2PM today. Pt states that an equipment trailer fell on his left foot and it had to be lifted off. He complains of moderate left foot pain at this time and states his worst pain is located on the top portion of his foot. Pt states that moving his foot, standing or twisting worsens his pain. He rates his left foot pain as a 8/10 at this time. He states that he took ibuprofen about 8 hours ago which provided him some minimal pain relief. Pt reports no prior left foot injuries but reports left leg surgery for a fracture. Pt reports NKDA. He denies toe pain, nausea, vomiting, fevers and chills.  No numbness, tingling.  History reviewed. No pertinent past medical history. History reviewed. No pertinent past surgical history. Family History  Problem Relation Age of Onset  . Diabetes Other   . Cancer Other   . CAD Other   . Stroke Other    History  Substance Use Topics  . Smoking status: Current Every Day Smoker    Types: Cigarettes  . Smokeless tobacco: Not on file  . Alcohol Use: Yes    Review of Systems  Constitutional: Negative for fever and chills.  Gastrointestinal: Negative for nausea and vomiting.  Musculoskeletal: Positive for joint swelling and arthralgias.    Allergies  Review of patient's allergies indicates no known allergies.  Home Medications   Prior to Admission medications   Medication Sig Start Date End Date Taking? Authorizing Provider   albuterol (PROVENTIL HFA;VENTOLIN HFA) 108 (90 BASE) MCG/ACT inhaler Inhale 1 puff into the lungs every 6 (six) hours as needed for wheezing or shortness of breath.    Historical Provider, MD  cetirizine-pseudoephedrine (ZYRTEC-D) 5-120 MG per tablet Take 1 tablet by mouth daily. 01/11/14   Gwyneth SproutWhitney Plunkett, MD  ibuprofen (ADVIL,MOTRIN) 800 MG tablet Take 1 tablet (800 mg total) by mouth 3 (three) times daily. 01/16/14   Fayrene HelperBowie Tran, PA-C   BP 139/84 mmHg  Pulse 82  Temp(Src) 97.6 F (36.4 C) (Oral)  Resp 19  SpO2 100% Physical Exam  Constitutional: He is oriented to person, place, and time. He appears well-developed and well-nourished. No distress.  HENT:  Head: Normocephalic and atraumatic.  Eyes: Conjunctivae are normal. Right eye exhibits no discharge. Left eye exhibits no discharge.  Cardiovascular: Normal rate, regular rhythm and normal heart sounds.   Pulmonary/Chest: Effort normal and breath sounds normal. No respiratory distress. He has no wheezes. He has no rales.  Musculoskeletal:  Less than 3 seconds cap refill. 2+ pedal pulses equal bilaterally. Swelling to left dorsum of foot with superficial abrasion distal to ankle joint. No proximal fibula head tenderness on left. ROM of left ankle with pain. 5/5 Strength and sensation intact.  Neurological: He is alert and oriented to person, place, and time. Coordination normal.  Skin: Skin is warm and dry. He is not diaphoretic.  Psychiatric: He has a normal mood and affect.  His behavior is normal.  Nursing note and vitals reviewed.   ED Course  Procedures   DIAGNOSTIC STUDIES: Oxygen Saturation is 100% on RA, normal by my interpretation.    COORDINATION OF CARE: 10:26 PM Discussed treatment plan with pt at bedside and pt agreed to plan.   Labs Review Labs Reviewed - No data to display  Imaging Review Dg Foot Complete Left  12/08/2014   CLINICAL DATA:  Injury to the left foot today. On equipment trailer fell onto the foot.  Pain all over the foot. Swelling and difficulty bearing weight.  EXAM: LEFT FOOT - COMPLETE 3+ VIEW  COMPARISON:  None.  FINDINGS: There is no evidence of fracture or dislocation. 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 There is no evidence of arthropathy or other focal bone abnormality. Soft tissues are unremarkable.  IMPRESSION: Negative.   Electronically Signed   By: Burman Nieves M.D.   On: 12/08/2014 22:26     EKG Interpretation None      MDM   Final diagnoses:  Foot injury, left, initial encounter   Patient presenting after foot injury today. He states a trailer landed on his foot. Patient with significant pain. VSS. Foot neurovascularly intact. No open wound. Full range of motion. He does have mild swelling. Patient states he can't walk on it due to the pain. X-ray without acute osseous abnormality. Patient's pain managed in the ED. He was given a Ace bandage as well as crutches for symptomatic relief. He is to follow-up with the wellness center for persistent pain. Discussed rice protocol as well as ibuprofen. He showed no acute distressed and stable for discharge.  Discussed return precautions with patient. Discussed all results and patient verbalizes understanding and agrees with plan.  I personally performed the services described in this documentation, which was scribed in my presence. The recorded information has been reviewed and is accurate.   Oswaldo Conroy, PA-C 12/09/14 [id number]  Mirian Mo, MD 12/13/14 [phone]

## 2016-02-21 IMAGING — US US SCROTUM
1 series · 14 of 25 positions shown · non-contrast
Comparison: None.

CLINICAL DATA: Left-sided testicular pain.

EXAM:
SCROTAL ULTRASOUND
DOPPLER ULTRASOUND OF THE TESTICLES
TECHNIQUE: Complete ultrasound examination of the testicles, epididymis, and
other scrotal structures was performed. Color and spectral Doppler
ultrasound were also utilized to evaluate blood flow to the
testicles.

[Series 1: us scrotum · 0.04mm/px · 14 of 60 slices shown]
[im 1/60]
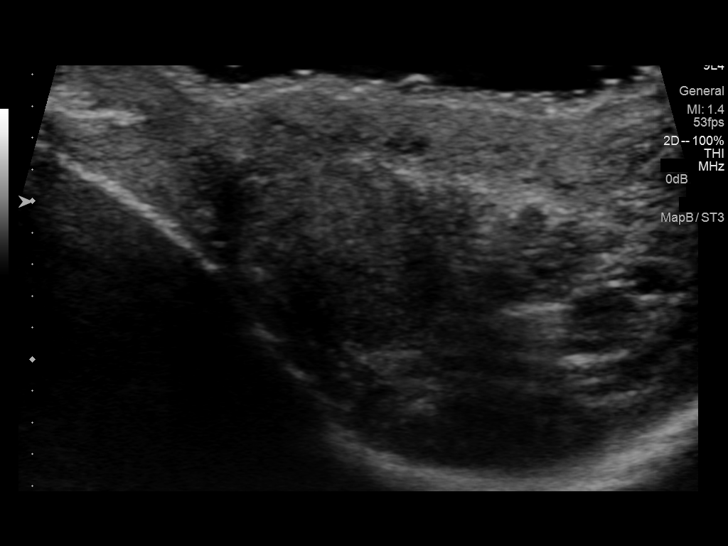
[im 5/60]
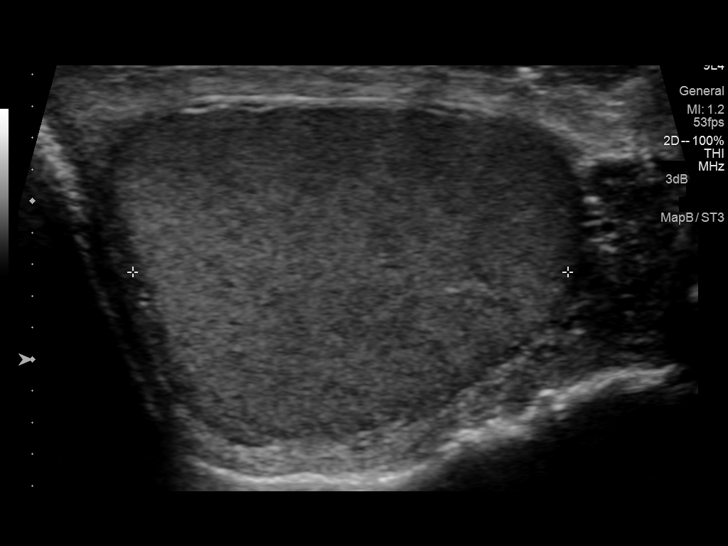
[im 10/60]
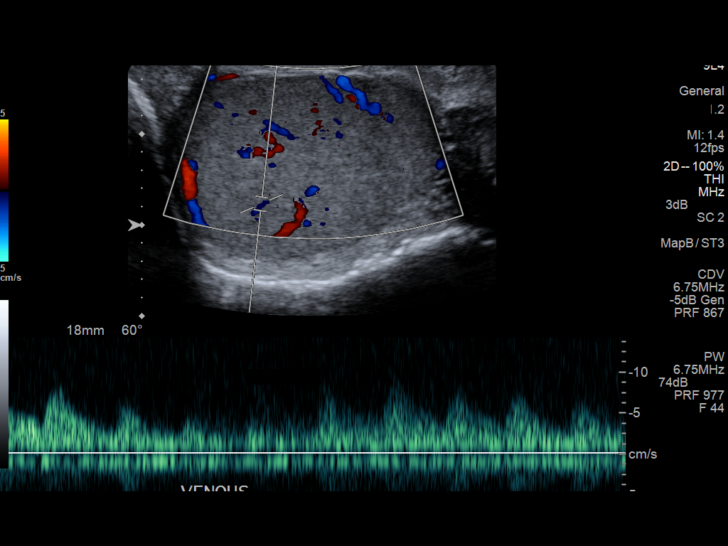
[im 15/60]
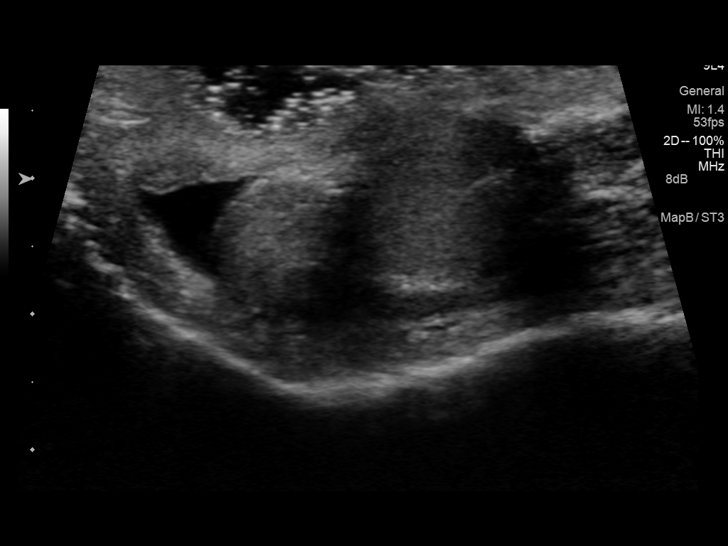
[im 20/60]
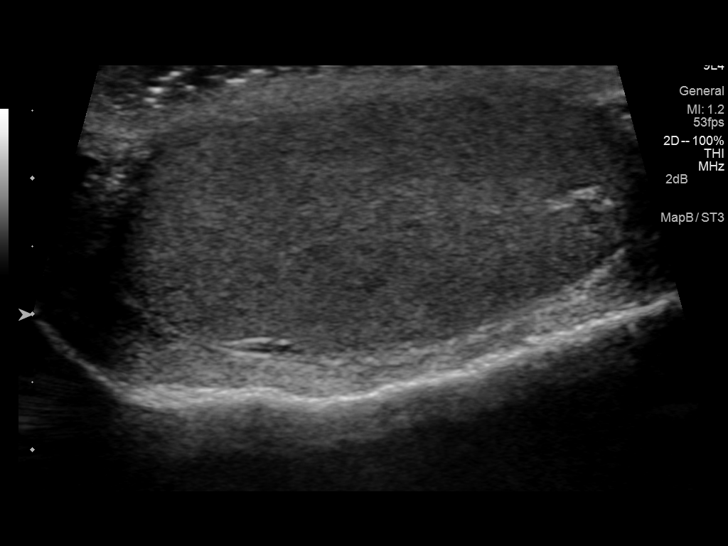
[im 23/60]
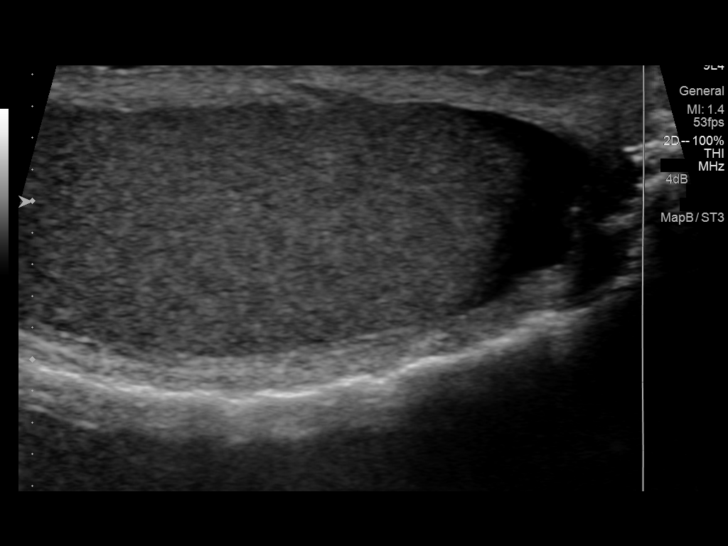
[im 28/60]
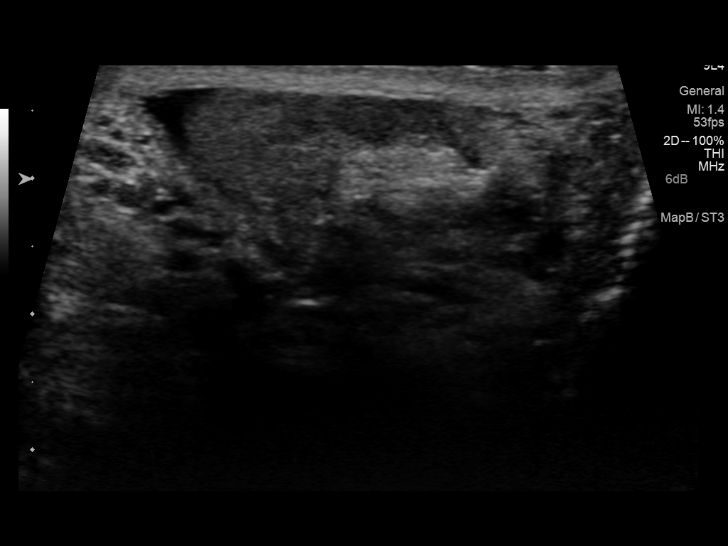
[im 32/60]
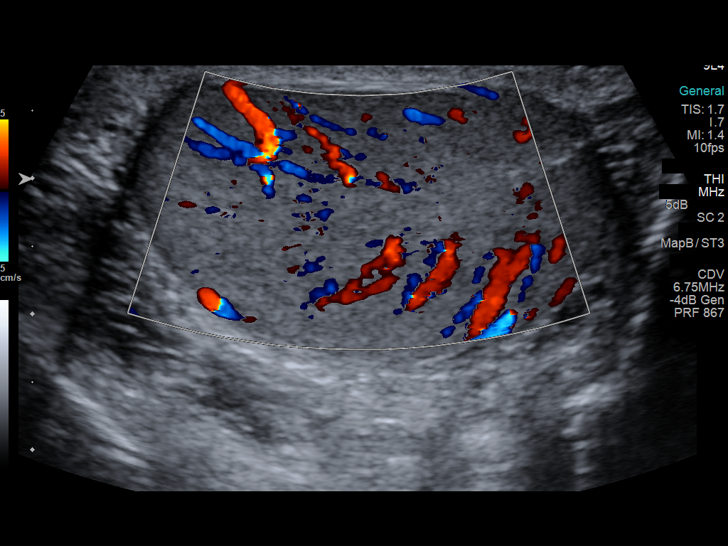
[im 37/60]
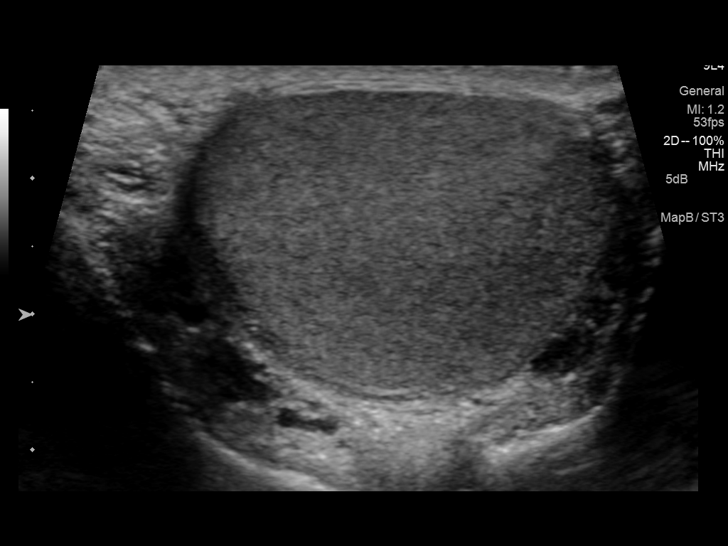
[im 40/60]
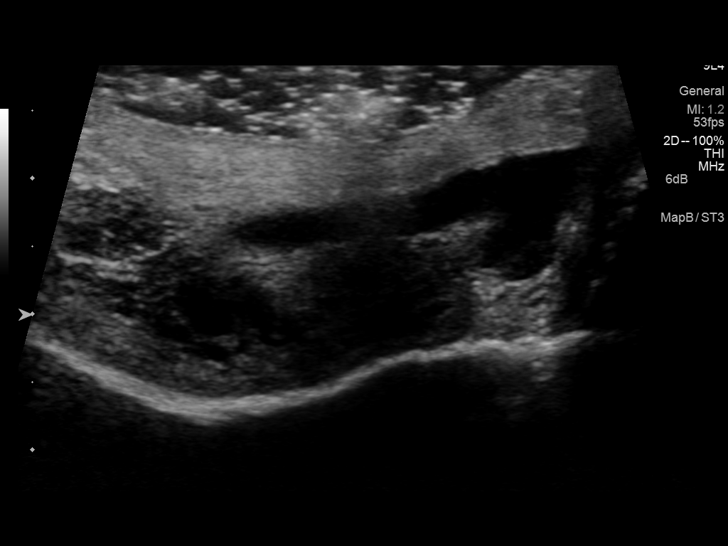
[im 45/60]
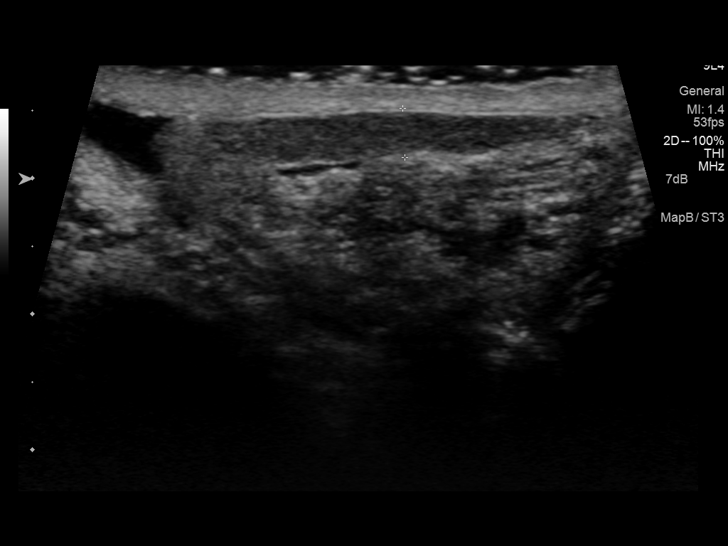
[im 50/60]
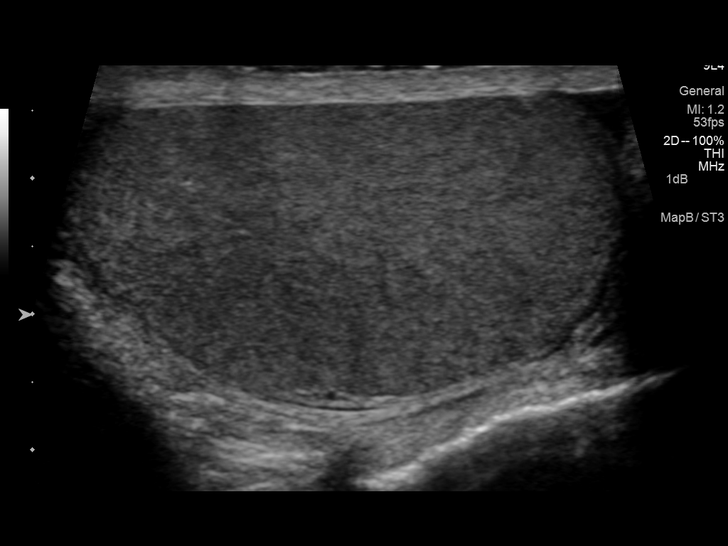
[im 55/60]
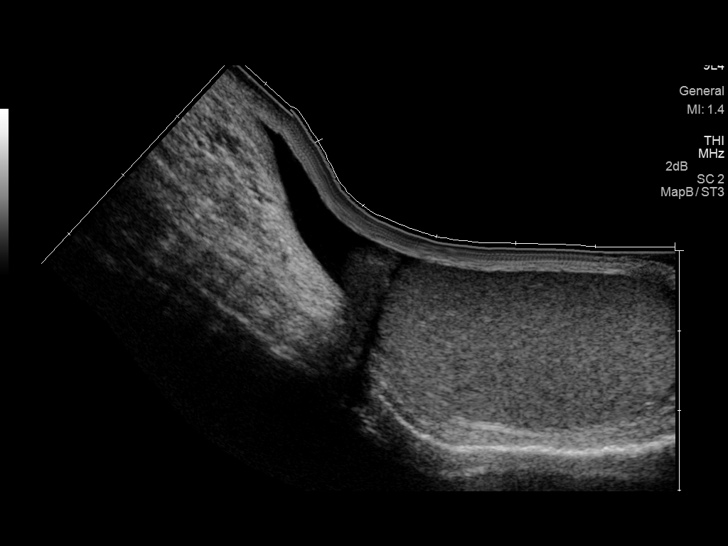
[im 60/60]
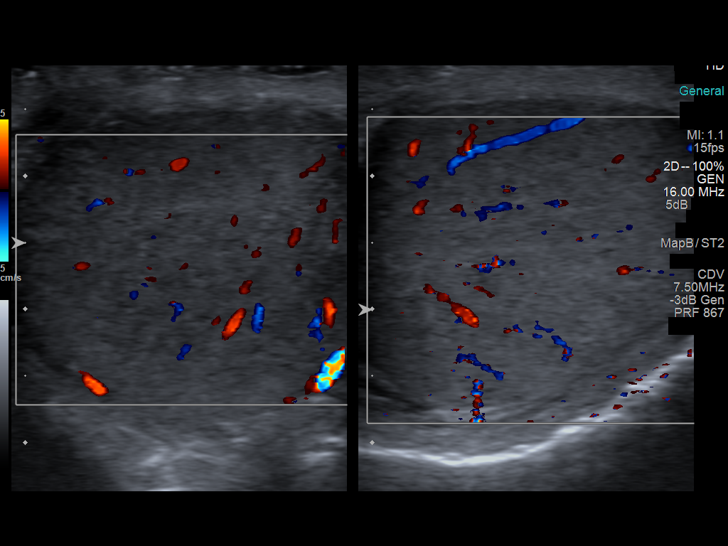

[14 of 25 positions shown; findings below may reference images not displayed]

FINDINGS: Right testicle

Measurements: 3.8 x 1.9 x 2.8 cm. Normal in morphology. Normal color
Doppler signal.

Left testicle

Measurements: 4.2 x 2.0 x 3.2 cm.. Normal in morphology. Normal
color Doppler signal.

Right epididymis:  Normal in size and appearance.

Left epididymis:  Normal in size and appearance.

Hydrocele: Small volume scrotal fluid bilaterally. This is likely
physiologic.

Varicocele:  Absent

Pulsed Doppler interrogation of both testes demonstrates low
resistance arterial and venous waveforms bilaterally.
IMPRESSION: No evidence of testicular torsion, orchitis/ epididymitis, or other
explanation for left-sided pain.

## 2016-05-27 ENCOUNTER — Emergency Department (HOSPITAL_COMMUNITY)
Admission: EM | Admit: 2016-05-27 | Discharge: 2016-05-27 | Disposition: A | Discharge status: Home / Self Care | Payer: Self-pay | Attending: Emergency Medicine | Admitting: Emergency Medicine

## 2016-05-27 ENCOUNTER — Encounter (HOSPITAL_COMMUNITY): Payer: Self-pay | Admitting: Emergency Medicine

## 2016-05-27 DIAGNOSIS — M542 Cervicalgia: Secondary | ICD-10-CM

## 2016-05-27 DIAGNOSIS — Z791 Long term (current) use of non-steroidal anti-inflammatories (NSAID): Secondary | ICD-10-CM | POA: Insufficient documentation

## 2016-05-27 DIAGNOSIS — J029 Acute pharyngitis, unspecified: Secondary | ICD-10-CM | POA: Insufficient documentation

## 2016-05-27 DIAGNOSIS — F17210 Nicotine dependence, cigarettes, uncomplicated: Secondary | ICD-10-CM | POA: Insufficient documentation

## 2016-05-27 LAB — RAPID STREP SCREEN (MED CTR MEBANE ONLY): Streptococcus, Group A Screen (Direct): NEGATIVE

## 2016-05-27 MED ORDER — NAPROXEN 500 MG PO TABS: 500.0000 mg | ORAL_TABLET | Freq: Two times a day (BID) | ORAL | 0 refills | Status: DC

## 2016-05-27 MED ORDER — CYCLOBENZAPRINE HCL 10 MG PO TABS: 10.0000 mg | ORAL_TABLET | Freq: Two times a day (BID) | ORAL | 0 refills | Status: DC | PRN

## 2016-05-27 NOTE — Discharge Instructions (Signed)
You have been seen today for a sore throat and neck. Your negative indicating it is unlikely you have strep throat. Follow up with PCP as needed should symptoms fail to resolve. Return to ED as needed. For your sore throat: Your symptoms are consistent with a viral illness. Viruses do not require antibiotics. Treatment is symptomatic care. Drink plenty of fluids and get plenty of rest. You should be drinking at least a liter of water an hour to stay hydrated. Ibuprofen, Naproxen, or Tylenol for pain or fever. Plain Mucinex may help relieve congestion. Warm liquids or Chloraseptic spray may help soothe the sore throat.  For your neck pain: Take it easy, but do not lay around too much as this may make the stiffness worse. Take 500 mg of naproxen every 12 hours or 800 mg of ibuprofen every 8 hours for the next 3 days. Take these medications with food to avoid upset stomach. Flexeril is a muscle relaxer and may help loosen stiff muscles. Do not take the Flexeril while driving or performing other dangerous activities.

## 2016-05-27 NOTE — ED Triage Notes (Addendum)
Pt complains of anterior neck pain, right shoulder, sore throat for the past 3 days. Pt states he has been lifting heavier weights lately.   Pt states he also feels like his right ear hearing is muffled, pt denies ear pain.

## 2016-05-27 NOTE — ED Provider Notes (Signed)
[facility]-EMERGENCY DEPT Provider Note   CSN: [id number] Arrival date & time: 05/27/16  1446  By signing my name below, I, Doreatha Martin, attest that this documentation has been prepared under the direction and in the presence of Sadat Sliwa, PA-C. Electronically Signed: Doreatha Martin, ED Scribe. 05/27/16. 3:39 PM.    History   Chief Complaint Chief Complaint  Patient presents with  . Neck Pain  . Sore Throat  . Hearing Problem    HPI Corey Rivas is a 25 y.o. male who presents to the Emergency Department complaining of moderate sore throat onset 4 days ago. He also complains of right-sided neck pain onset 6 days ago and bilateral "muffled" hearing. Pt states his neck pain began after lifting a heavy weight in the gym, but otherwise denies injury, trauma or falls contributing to his neck pain. Pt states his sore throat is worsened with swallowing. He states his neck pain is worsened with heavy lifting and with prolonged periods of immobilization, such as at night. He denies ear pain, difficulty swallowing or tolerating secretions, fever/chills, difficulty breathing, or any additional complaints.    The history is provided by the patient. No language interpreter was used.    History reviewed. No pertinent past medical history.  There are no active problems to display for this patient.   History reviewed. No pertinent surgical history.    Home Medications    Prior to Admission medications   Medication Sig Start Date End Date Taking? Authorizing Provider  albuterol (PROVENTIL HFA;VENTOLIN HFA) 108 (90 BASE) MCG/ACT inhaler Inhale 1 puff into the lungs every 6 (six) hours as needed for wheezing or shortness of breath.    Historical Provider, MD  cetirizine-pseudoephedrine (ZYRTEC-D) 5-120 MG per tablet Take 1 tablet by mouth daily. 01/11/14   Gwyneth Sprout, MD  cyclobenzaprine (FLEXERIL) 10 MG tablet Take 1 tablet (10 mg total) by mouth 2 (two) times daily as needed for muscle spasms.  05/27/16   Frona Yost C Shaheem Pichon, PA-C  ibuprofen (ADVIL,MOTRIN) 800 MG tablet Take 1 tablet (800 mg total) by mouth 3 (three) times daily. 01/16/14   Fayrene Helper, PA-C  naproxen (NAPROSYN) 500 MG tablet Take 1 tablet (500 mg total) by mouth 2 (two) times daily. 05/27/16   Anselm Pancoast, PA-C    Family History Family History  Problem Relation Age of Onset  . Diabetes Other   . Cancer Other   . CAD Other   . Stroke Other     Social History Social History  Substance Use Topics  . Smoking status: Current Every Day Smoker    Types: Cigarettes  . Smokeless tobacco: Never Used  . Alcohol use Yes     Allergies   Review of patient's allergies indicates no known allergies.   Review of Systems Review of Systems  HENT: Positive for congestion and sore throat. Negative for drooling, ear pain, facial swelling and trouble swallowing.   Musculoskeletal: Positive for neck pain.  All other systems reviewed and are negative.   Physical Exam Updated Vital Signs BP 136/70 (BP Location: Left Arm)   Pulse 77   Temp 98.8 F (37.1 C) (Oral)   Resp 18   SpO2 98%   Physical Exam  Constitutional: He appears well-developed and well-nourished. No distress.  HENT:  Head: Normocephalic and atraumatic.  Right Ear: Tympanic membrane, external ear and ear canal normal.  Left Ear: Tympanic membrane, external ear and ear canal normal.  Mouth/Throat: Posterior oropharyngeal edema present.  Some edema to posterior pharynx,  equal bilaterally.  Ears are normal bilaterally.   Eyes: Conjunctivae are normal.  Neck: Normal range of motion. Neck supple.  Cardiovascular: Normal rate, regular rhythm and intact distal pulses.   Pulmonary/Chest: Effort normal and breath sounds normal. No respiratory distress.  Abdominal: Soft. He exhibits no distension. There is no tenderness. There is no guarding.  Musculoskeletal: Normal range of motion. He exhibits tenderness. He exhibits no edema.  Tenderness to the right  sternocleidomastoid muscles. Increased pain with ROM of the neck and to palpation.   Lymphadenopathy:    He has no cervical adenopathy.  Neurological: He is alert.  Skin: Skin is warm and dry. He is not diaphoretic.  Psychiatric: He has a normal mood and affect. His behavior is normal.  Nursing note and vitals reviewed.   ED Treatments / Results  Labs (all labs ordered are listed, but only abnormal results are displayed) Labs Reviewed  RAPID STREP SCREEN (NOT AT [facility])  CULTURE, GROUP A STREP [facility])    EKG  EKG Interpretation None       Radiology No results found.  Procedures Procedures (including critical care time)  DIAGNOSTIC STUDIES: Oxygen Saturation is 98% on RA, normal by my interpretation.    COORDINATION OF CARE: 3:38 PM Discussed treatment plan with pt at bedside which includes rapid strep and pt agreed to plan.    Medications Ordered in ED Medications - No data to display   Initial Impression / Assessment and Plan / ED Course  I have reviewed the triage vital signs and the nursing notes.  Pertinent labs & imaging results that were available during my care of the patient were reviewed by me and considered in my medical decision making (see chart for details).  Clinical Course    Patient likely has 2 separate problems. His neck pain is likely from muscle strain due to heavy lifting. His sore throat and ear complaints are likely due to an URI. The patient was given instructions for home care as well as return precautions. Patient voices understanding of these instructions, accepts the plan, and is comfortable with discharge.  Final Clinical Impressions(s) / ED Diagnoses   Final diagnoses:  Sore throat  Neck pain    New Prescriptions New Prescriptions   CYCLOBENZAPRINE (FLEXERIL) 10 MG TABLET    Take 1 tablet (10 mg total) by mouth 2 (two) times daily as needed for muscle spasms.   NAPROXEN (NAPROSYN) 500 MG TABLET    Take 1 tablet (500 mg total) by  mouth 2 (two) times daily.    I personally performed the services described in this documentation, which was scribed in my presence. The recorded information has been reviewed and is accurate.     Anselm PancoastShawn C Zarian Colpitts, PA-C 05/27/16 1615    Raeford RazorStephen Kohut, MD 06/07/16 [phone]

## 2016-05-29 LAB — CULTURE, GROUP A STREP (THRC)

## 2016-06-18 ENCOUNTER — Emergency Department (HOSPITAL_COMMUNITY)
Admission: EM | Admit: 2016-06-18 | Discharge: 2016-06-18 | Disposition: A | Discharge status: Home / Self Care | Payer: Self-pay | Attending: Emergency Medicine | Admitting: Emergency Medicine

## 2016-06-18 ENCOUNTER — Encounter (HOSPITAL_COMMUNITY): Payer: Self-pay | Admitting: Emergency Medicine

## 2016-06-18 DIAGNOSIS — F17210 Nicotine dependence, cigarettes, uncomplicated: Secondary | ICD-10-CM | POA: Insufficient documentation

## 2016-06-18 DIAGNOSIS — K0889 Other specified disorders of teeth and supporting structures: Secondary | ICD-10-CM | POA: Insufficient documentation

## 2016-06-18 DIAGNOSIS — Z79899 Other long term (current) drug therapy: Secondary | ICD-10-CM | POA: Insufficient documentation

## 2016-06-18 MED ORDER — PENICILLIN V POTASSIUM 500 MG PO TABS: 500.0000 mg | ORAL_TABLET | Freq: Four times a day (QID) | ORAL | 0 refills | Status: AC

## 2016-06-18 NOTE — ED Provider Notes (Signed)
[facility]-EMERGENCY DEPT Provider Note   CSN: [id number] Arrival date & time: 06/18/16  1933     History   Chief Complaint Chief Complaint  Patient presents with  . Dental Pain    HPI Corey Rivas is a 25 y.o. male.  HPI   25 year old male presents today with complaints of dental pain. Patient reports symptoms started approximately 3 weeks ago soreness in the back of his throat. He reports that feeling has somewhat subsided, but notes that he's having swelling and pain to the left upper jaw. Patient denies any fevers at home, difficulty swallowing or breathing. He denies any nausea or vomiting, difficulty swallowing or painful swallowing.   History reviewed. No pertinent past medical history.  There are no active problems to display for this patient.   History reviewed. No pertinent surgical history.     Home Medications    Prior to Admission medications   Medication Sig Start Date End Date Taking? Authorizing Provider  albuterol (PROVENTIL HFA;VENTOLIN HFA) 108 (90 BASE) MCG/ACT inhaler Inhale 1 puff into the lungs every 6 (six) hours as needed for wheezing or shortness of breath.    Historical Provider, MD  cetirizine-pseudoephedrine (ZYRTEC-D) 5-120 MG per tablet Take 1 tablet by mouth daily. 01/11/14   Gwyneth SproutWhitney Plunkett, MD  cyclobenzaprine (FLEXERIL) 10 MG tablet Take 1 tablet (10 mg total) by mouth 2 (two) times daily as needed for muscle spasms. 05/27/16   Shawn C Joy, PA-C  ibuprofen (ADVIL,MOTRIN) 800 MG tablet Take 1 tablet (800 mg total) by mouth 3 (three) times daily. 01/16/14   Fayrene HelperBowie Tran, PA-C  naproxen (NAPROSYN) 500 MG tablet Take 1 tablet (500 mg total) by mouth 2 (two) times daily. 05/27/16   Shawn C Joy, PA-C  penicillin v potassium (VEETID) 500 MG tablet Take 1 tablet (500 mg total) by mouth 4 (four) times daily. 06/18/16 06/25/16  Eyvonne MechanicJeffrey Peace Noyes, PA-C    Family History Family History  Problem Relation Age of Onset  . Diabetes Other   . Cancer Other   . CAD  Other   . Stroke Other     Social History Social History  Substance Use Topics  . Smoking status: Current Every Day Smoker    Types: Cigarettes  . Smokeless tobacco: Never Used  . Alcohol use Yes     Allergies   Review of patient's allergies indicates no known allergies.   Review of Systems Review of Systems  All other systems reviewed and are negative.    Physical Exam Updated Vital Signs BP 123/82 (BP Location: Right Arm)   Pulse 77   Temp 97.5 F (36.4 C) (Oral)   Resp 18   Ht 6\' 1"  (1.854 m)   Wt 88.9 kg   SpO2 100%   BMI 25.86 kg/m   Physical Exam  Constitutional: He is oriented to person, place, and time. He appears well-developed and well-nourished. No distress.  HENT:  Head: Normocephalic.  Mouth/Throat: Uvula is midline, oropharynx is clear and moist and mucous membranes are normal. No oropharyngeal exudate, posterior oropharyngeal edema, posterior oropharyngeal erythema or tonsillar abscesses.  External exam shows no asymmetry of the jaw line or face, no signs of obvious swelling, edema, infection. Full active range of motion of the jaw. Neck is supple with full active range of motion, no tenderness to palpation of the soft tissues  Gumline palpated no obvious signs of infection including warmth, redness, abscess, tenderness. Posterior oropharynx clear with no signs of infection, uvula is midline and rises with phonation,  tonsils present and normal in size, symmetrical bilateral, tongue is normal soft touch with full active range of motion, floor mouth is soft nontender.  Eyes: Conjunctivae are normal. Pupils are equal, round, and reactive to light. Right eye exhibits no discharge. Left eye exhibits no discharge.  Neck: Normal range of motion. Neck supple. No JVD present. No tracheal deviation present. No thyromegaly present.  Pulmonary/Chest: No stridor.  Lymphadenopathy:    He has no cervical adenopathy.  Neurological: He is alert and oriented to person,  place, and time.  Skin: Skin is warm and dry. No rash noted. He is not diaphoretic. No erythema. No pallor.  Psychiatric: He has a normal mood and affect. His behavior is normal. Judgment and thought content normal.  Nursing note and vitals reviewed.    ED Treatments / Results  Labs (all labs ordered are listed, but only abnormal results are displayed) Labs Reviewed - No data to display  EKG  EKG Interpretation None       Radiology No results found.  Procedures Procedures (including critical care time)  Medications Ordered in ED Medications - No data to display   Initial Impression / Assessment and Plan / ED Course  I have reviewed the triage vital signs and the nursing notes.  Pertinent labs & imaging results that were available during my care of the patient were reviewed by me and considered in my medical decision making (see chart for details).  Clinical Course     Final Clinical Impressions(s) / ED Diagnoses   Final diagnoses:  Pain, dental   Labs:  Imaging:  Consults:  Therapeutics:  Discharge Meds:   Assessment/Plan: 25 year old male presents today with unconjugated dental pain. Patient insists that he has swelling in his mouth, I see no signs of swelling. Patient will be placed on antibiotics, given dental follow-up. Strict precautions given. He verbalized understanding and agreement to today's plan had no further questions or concerns      New Prescriptions Discharge Medication List as of 06/18/2016  9:37 PM    START taking these medications   Details  penicillin v potassium (VEETID) 500 MG tablet Take 1 tablet (500 mg total) by mouth 4 (four) times daily., Starting Mon 06/18/2016, Until Mon 06/25/2016, Print         Eyvonne Mechanic, PA-C 06/18/16 2203    Laurence Spates, MD 06/19/16 [phone]

## 2016-06-18 NOTE — Discharge Instructions (Signed)
Please read attached information. If you experience any new or worsening signs or symptoms please return to the emergency room for evaluation. Please follow-up with your primary care provider or specialist as discussed. Please use medication prescribed only as directed and discontinue taking if you have any concerning signs or symptoms.   °

## 2016-06-18 NOTE — ED Triage Notes (Signed)
Pt c/o severe left dental/mouth pain. Pt states it started 3 weeks ago with a sore throat and now has "a rubbing feeling on the left side, everything is swelling. My head hurts and my ear is still hurting."

## 2016-06-19 ENCOUNTER — Ambulatory Visit: Payer: Self-pay

## 2016-10-14 ENCOUNTER — Emergency Department (HOSPITAL_COMMUNITY)
Admission: EM | Admit: 2016-10-14 | Discharge: 2016-10-14 | Disposition: A | Discharge status: Home / Self Care | Payer: BLUE CROSS/BLUE SHIELD | Attending: Emergency Medicine | Admitting: Emergency Medicine

## 2016-10-14 ENCOUNTER — Encounter (HOSPITAL_COMMUNITY): Payer: Self-pay | Admitting: Emergency Medicine

## 2016-10-14 DIAGNOSIS — R1012 Left upper quadrant pain: Secondary | ICD-10-CM

## 2016-10-14 DIAGNOSIS — F17210 Nicotine dependence, cigarettes, uncomplicated: Secondary | ICD-10-CM | POA: Insufficient documentation

## 2016-10-14 LAB — CBC
HEMATOCRIT: 44.2 % (ref 39.0–52.0)
HEMOGLOBIN: 15.3 g/dL (ref 13.0–17.0)
MCH: 31 pg (ref 26.0–34.0)
MCHC: 34.6 g/dL (ref 30.0–36.0)
MCV: 89.7 fL (ref 78.0–100.0)
Platelets: 212 10*3/uL (ref 150–400)
RBC: 4.93 MIL/uL (ref 4.22–5.81)
RDW: 12.6 % (ref 11.5–15.5)
WBC: 7.2 10*3/uL (ref 4.0–10.5)

## 2016-10-14 LAB — URINALYSIS, ROUTINE W REFLEX MICROSCOPIC
BACTERIA UA: NONE SEEN
Bilirubin Urine: NEGATIVE
Glucose, UA: NEGATIVE mg/dL
Hgb urine dipstick: NEGATIVE
Ketones, ur: NEGATIVE mg/dL
Leukocytes, UA: NEGATIVE
Nitrite: NEGATIVE
PROTEIN: 100 mg/dL — AB
Specific Gravity, Urine: 1.021 (ref 1.005–1.030)
pH: 6 (ref 5.0–8.0)

## 2016-10-14 LAB — COMPREHENSIVE METABOLIC PANEL
ALK PHOS: 48 U/L (ref 38–126)
ALT: 15 U/L — ABNORMAL LOW (ref 17–63)
AST: 18 U/L (ref 15–41)
Albumin: 4.1 g/dL (ref 3.5–5.0)
Anion gap: 11 (ref 5–15)
BUN: 8 mg/dL (ref 6–20)
CO2: 27 mmol/L (ref 22–32)
Calcium: 9.8 mg/dL (ref 8.9–10.3)
Chloride: 104 mmol/L (ref 101–111)
Creatinine, Ser: 1.13 mg/dL (ref 0.61–1.24)
GFR calc Af Amer: >60 mL/min (ref >60)
GFR calc non Af Amer: >60 mL/min (ref >60)
Glucose, Bld: 104 mg/dL — ABNORMAL HIGH (ref 65–99)
Potassium: 3.7 mmol/L (ref 3.5–5.1)
SODIUM: 142 mmol/L (ref 135–145)
Total Bilirubin: 0.5 mg/dL (ref 0.3–1.2)
Total Protein: 7.1 g/dL (ref 6.5–8.1)

## 2016-10-14 LAB — LIPASE, BLOOD: Lipase: 31 U/L (ref 11–51)

## 2016-10-14 MED ORDER — ONDANSETRON HCL 4 MG PO TABS: 4.0000 mg | ORAL_TABLET | Freq: Three times a day (TID) | ORAL | 0 refills | Status: DC | PRN

## 2016-10-14 MED ORDER — PANTOPRAZOLE SODIUM 20 MG PO TBEC: 20.0000 mg | DELAYED_RELEASE_TABLET | Freq: Every day | ORAL | 0 refills | Status: DC

## 2016-10-14 MED ORDER — PANTOPRAZOLE SODIUM 40 MG PO TBEC
40.0000 mg | DELAYED_RELEASE_TABLET | Freq: Once | ORAL | Status: AC
  Administered 2016-10-14: 40 mg via ORAL
  Filled 2016-10-14: qty 1

## 2016-10-14 MED ORDER — SUCRALFATE 1 G PO TABS: 1.0000 g | ORAL_TABLET | Freq: Three times a day (TID) | ORAL | 0 refills | Status: DC

## 2016-10-14 MED ORDER — PANTOPRAZOLE SODIUM 40 MG IV SOLR: 40.0000 mg | Freq: Once | INTRAVENOUS | Status: DC

## 2016-10-14 MED ORDER — GI COCKTAIL ~~LOC~~
30.0000 mL | Freq: Once | ORAL | Status: AC
  Administered 2016-10-14: 30 mL via ORAL
  Filled 2016-10-14: qty 30

## 2016-10-14 NOTE — ED Triage Notes (Signed)
Pt c/o abdominal pain and generalized weakness for 1 1/2 months. Pt denies N/V. Pt reports constipation, last BM was yesterday diarrhea.

## 2016-10-14 NOTE — Discharge Instructions (Signed)
Read the information below.  Use the prescribed medication as directed.  Please discuss all new medications with your pharmacist.  You may return to the Emergency Department at any time for worsening condition or any new symptoms that concern you.   If you develop high fevers, worsening abdominal pain, uncontrolled vomiting, or are unable to tolerate fluids by mouth, return to the ER for a recheck.  ° °

## 2016-10-14 NOTE — ED Provider Notes (Signed)
[facility]-EMERGENCY DEPT Provider Note   CSN: [id number] Arrival date & time: 10/14/16  1419     History   Chief Complaint Chief Complaint  Patient presents with  . Abdominal Pain  . Weakness    HPI Corey Rivas is a 26 y.o. male.  HPI   Patient presents with LUQ abdominal pain  X 1.5 months.  The pain is constant with exacerbations with smoking marijuana, eating, and drinking.  The more extreme pain occurs about 10-15 minutes after eating.  Associated nausea with the pain, no vomiting.  Has lost 30-40 pounds over 1 year.  Has decreased bowel movements, occurring Q2-3 days, previously every 2 days.  Has tried ibuprofen and laxatives and feels this makes his symptoms worse.  Bowel movements are normally soft, has seen dark stools once and small amount of blood mixed in a few times.   Has also seen mucus.   Pt has noted some improvement with eating smaller meals.  Denies urinary symptoms.  Mother has hx gastric ulcers.  No family hx crohn's disease or ulcerative colitis.     History reviewed. No pertinent past medical history.  There are no active problems to display for this patient.   History reviewed. No pertinent surgical history.     Home Medications    Prior to Admission medications   Medication Sig Start Date End Date Taking? Authorizing Provider  albuterol (PROVENTIL HFA;VENTOLIN HFA) 108 (90 BASE) MCG/ACT inhaler Inhale 1 puff into the lungs every 6 (six) hours as needed for wheezing or shortness of breath.    Historical Provider, MD  cetirizine-pseudoephedrine (ZYRTEC-D) 5-120 MG per tablet Take 1 tablet by mouth daily. 01/11/14   Gwyneth Sprout, MD  cyclobenzaprine (FLEXERIL) 10 MG tablet Take 1 tablet (10 mg total) by mouth 2 (two) times daily as needed for muscle spasms. 05/27/16   Shawn C Joy, PA-C  ibuprofen (ADVIL,MOTRIN) 800 MG tablet Take 1 tablet (800 mg total) by mouth 3 (three) times daily. 01/16/14   Fayrene Helper, PA-C  naproxen (NAPROSYN) 500 MG tablet Take 1  tablet (500 mg total) by mouth 2 (two) times daily. 05/27/16   Shawn C Joy, PA-C  ondansetron (ZOFRAN) 4 MG tablet Take 1 tablet (4 mg total) by mouth every 8 (eight) hours as needed for nausea or vomiting. 10/14/16   Trixie Dredge, PA-C  pantoprazole (PROTONIX) 20 MG tablet Take 1 tablet (20 mg total) by mouth daily. 10/14/16   Trixie Dredge, PA-C  sucralfate (CARAFATE) 1 g tablet Take 1 tablet (1 g total) by mouth 4 (four) times daily -  with meals and at bedtime. 10/14/16   Trixie Dredge, PA-C    Family History Family History  Problem Relation Age of Onset  . Diabetes Other   . Cancer Other   . CAD Other   . Stroke Other     Social History Social History  Substance Use Topics  . Smoking status: Current Every Day Smoker    Types: Cigarettes  . Smokeless tobacco: Never Used  . Alcohol use Yes     Allergies   Patient has no known allergies.   Review of Systems Review of Systems  All other systems reviewed and are negative.    Physical Exam Updated Vital Signs BP 113/74 (BP Location: Right Arm)   Pulse 66   Temp 99.2 F (37.3 C) (Oral)   Resp 16   Ht 6\' 1"  (1.854 m)   Wt 88.5 kg   SpO2 99%   BMI 25.73 kg/m  Physical Exam  Constitutional: He appears well-developed and well-nourished. No distress.  HENT:  Head: Normocephalic and atraumatic.  Neck: Neck supple.  Cardiovascular: Normal rate and regular rhythm.   Pulmonary/Chest: Effort normal and breath sounds normal. No respiratory distress. He has no wheezes. He has no rales.  Abdominal: Soft. Bowel sounds are normal. He exhibits no distension and no mass. There is tenderness (mild tenderness LUQ). There is no rebound and no guarding.  Neurological: He is alert. He exhibits normal muscle tone.  Skin: He is not diaphoretic.  Nursing note and vitals reviewed.    ED Treatments / Results  Labs (all labs ordered are listed, but only abnormal results are displayed) Labs Reviewed  COMPREHENSIVE METABOLIC PANEL - Abnormal;  Notable for the following:       Result Value   Glucose, Bld 104 (*)    ALT 15 (*)    All other components within normal limits  URINALYSIS, ROUTINE W REFLEX MICROSCOPIC - Abnormal; Notable for the following:    Protein, ur 100 (*)    Squamous Epithelial / LPF 0-5 (*)    All other components within normal limits  LIPASE, BLOOD  CBC    EKG  EKG Interpretation None       Radiology No results found.  Procedures Procedures (including critical care time)  Medications Ordered in ED Medications  gi cocktail (Maalox,Lidocaine,Donnatal) (30 mLs Oral Given 10/14/16 1834)  pantoprazole (PROTONIX) EC tablet 40 mg (40 mg Oral Given 10/14/16 1833)     Initial Impression / Assessment and Plan / ED Course  I have reviewed the triage vital signs and the nursing notes.  Pertinent labs & imaging results that were available during my care of the patient were reviewed by me and considered in my medical decision making (see chart for details).  Clinical Course as of Oct 18 947  Sun Oct 14, 2016  1610 Patient reports improvement with GI cocktail.  He does not want to wait for ultrasound.  I suspect gastritis vs PUD most likely.  Will d/c with prescriptions, PCP, GI follow up.    [EW]    Clinical Course User Index [EW] Trixie Dredge, PA-C    Afebrile, nontoxic patient with LUQ abdominal pain, worse with eating, but constant pain x 1.5 months.  Also c/o weight loss.  Labs reassuring.  [place] ordered but pt declined, did not want to wait.  PO protonix, GI cocktail given with great improvement.  Suspect most likely PUD vs gastritis.  D/C home with zofran, protonix, carafate, GI, PCP follow up.  Discussed result, findings, treatment, and follow up  with patient.  Pt given return precautions.  Pt verbalizes understanding and agrees with plan.       Final Clinical Impressions(s) / ED Diagnoses   Final diagnoses:  LUQ abdominal pain    New Prescriptions Discharge Medication List as of 10/14/2016  7:48  PM    START taking these medications   Details  ondansetron (ZOFRAN) 4 MG tablet Take 1 tablet (4 mg total) by mouth every 8 (eight) hours as needed for nausea or vomiting., Starting Sun 10/14/2016, Print    pantoprazole (PROTONIX) 20 MG tablet Take 1 tablet (20 mg total) by mouth daily., Starting Sun 10/14/2016, Print    sucralfate (CARAFATE) 1 g tablet Take 1 tablet (1 g total) by mouth 4 (four) times daily -  with meals and at bedtime., Starting Sun 10/14/2016, Print         [place], PA-C 10/17/16 [phone]  Arby BarretteMarcy Pfeiffer, MD 10/25/16 [phone]

## 2017-01-12 IMAGING — DX DG FOOT COMPLETE 3+V*L*
3 series · 3 of 3 positions shown · non-contrast
Comparison: None.

CLINICAL DATA: Injury to the left foot today. On equipment trailer
fell onto the foot. Pain all over the foot. Swelling and difficulty
bearing weight.

EXAM:
LEFT FOOT - COMPLETE 3+ VIEW

[foot ap]
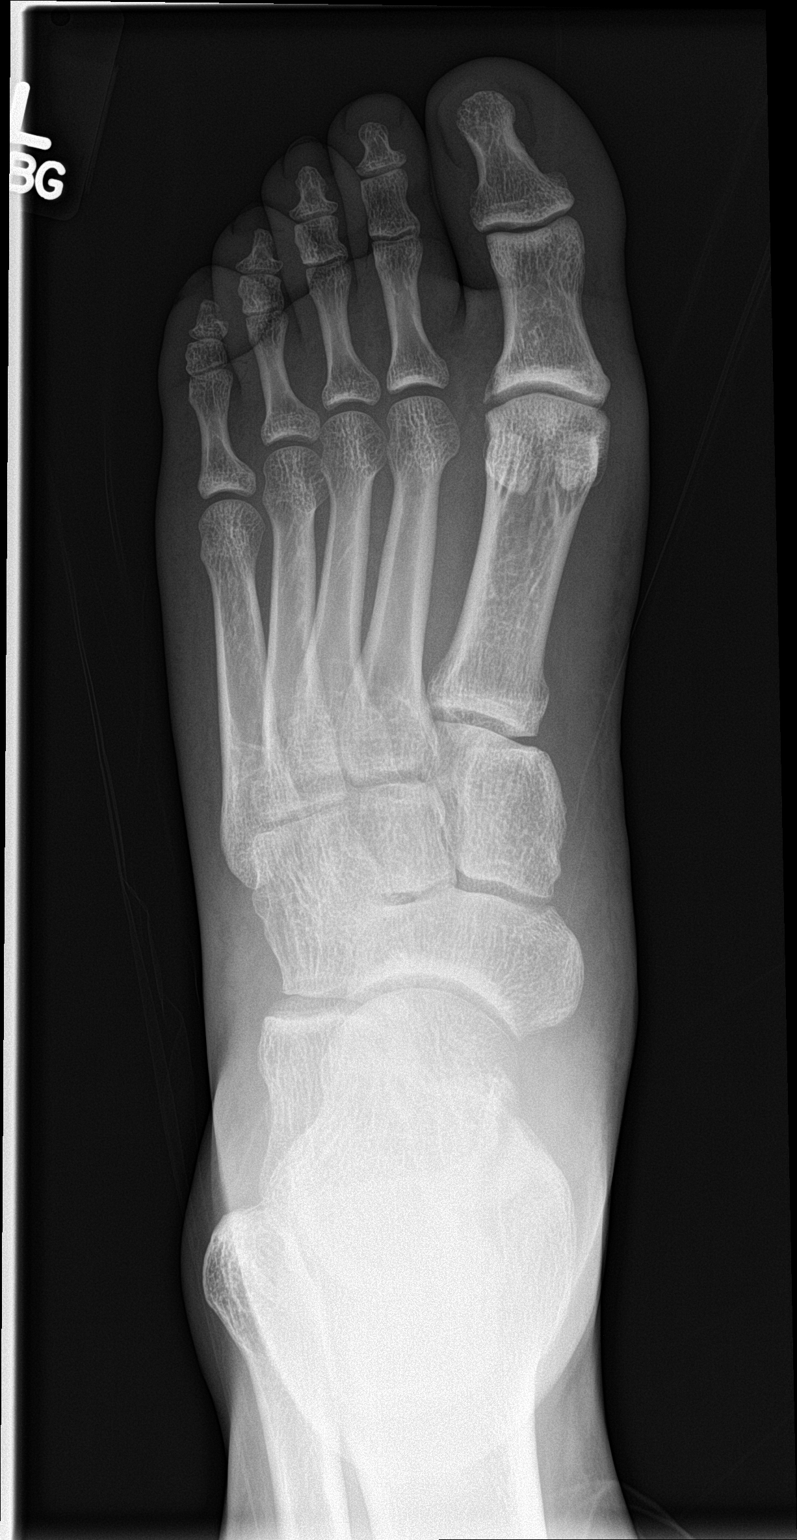

[foot obl]
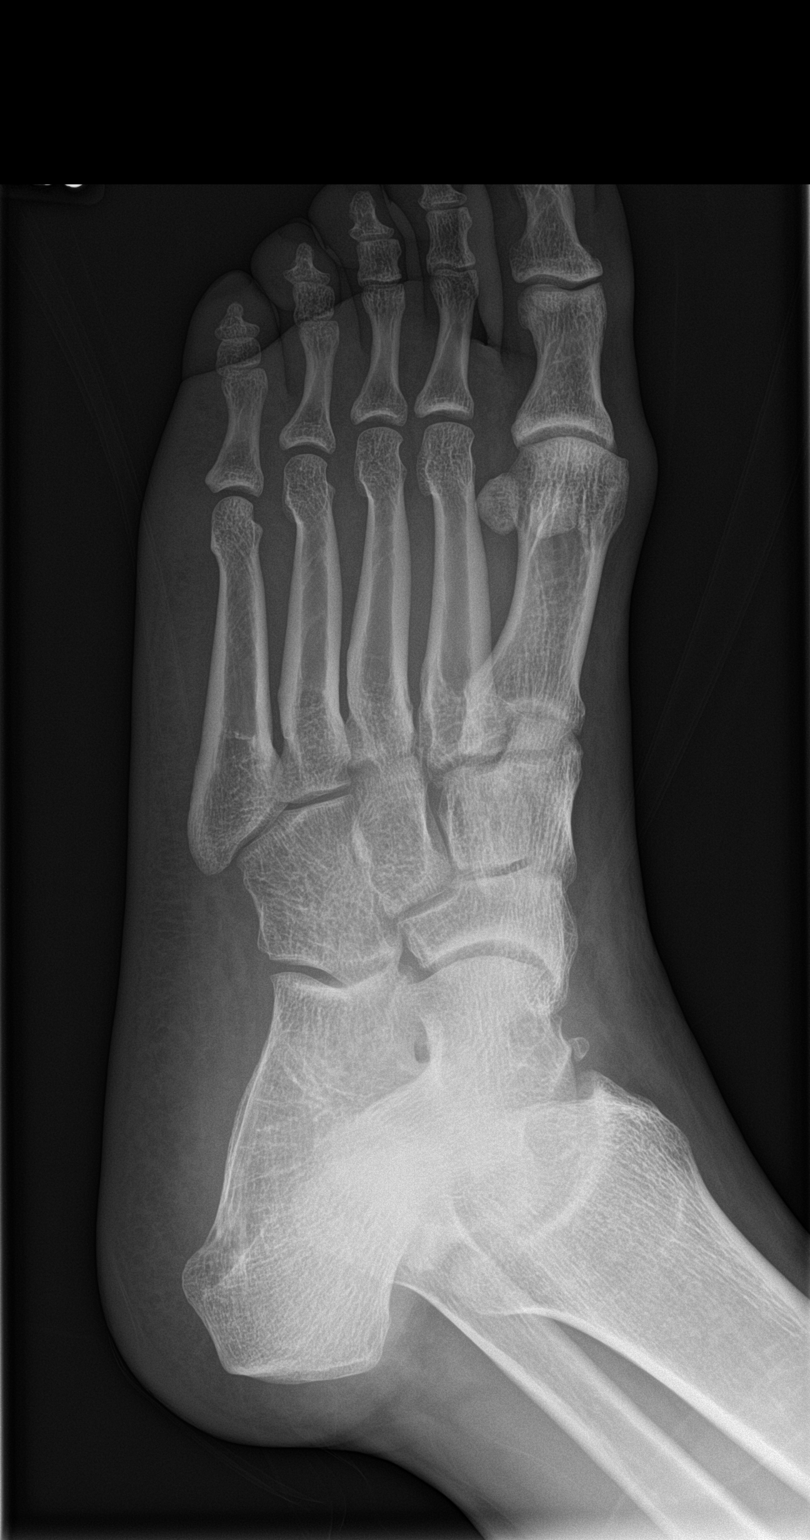

[foot lat]
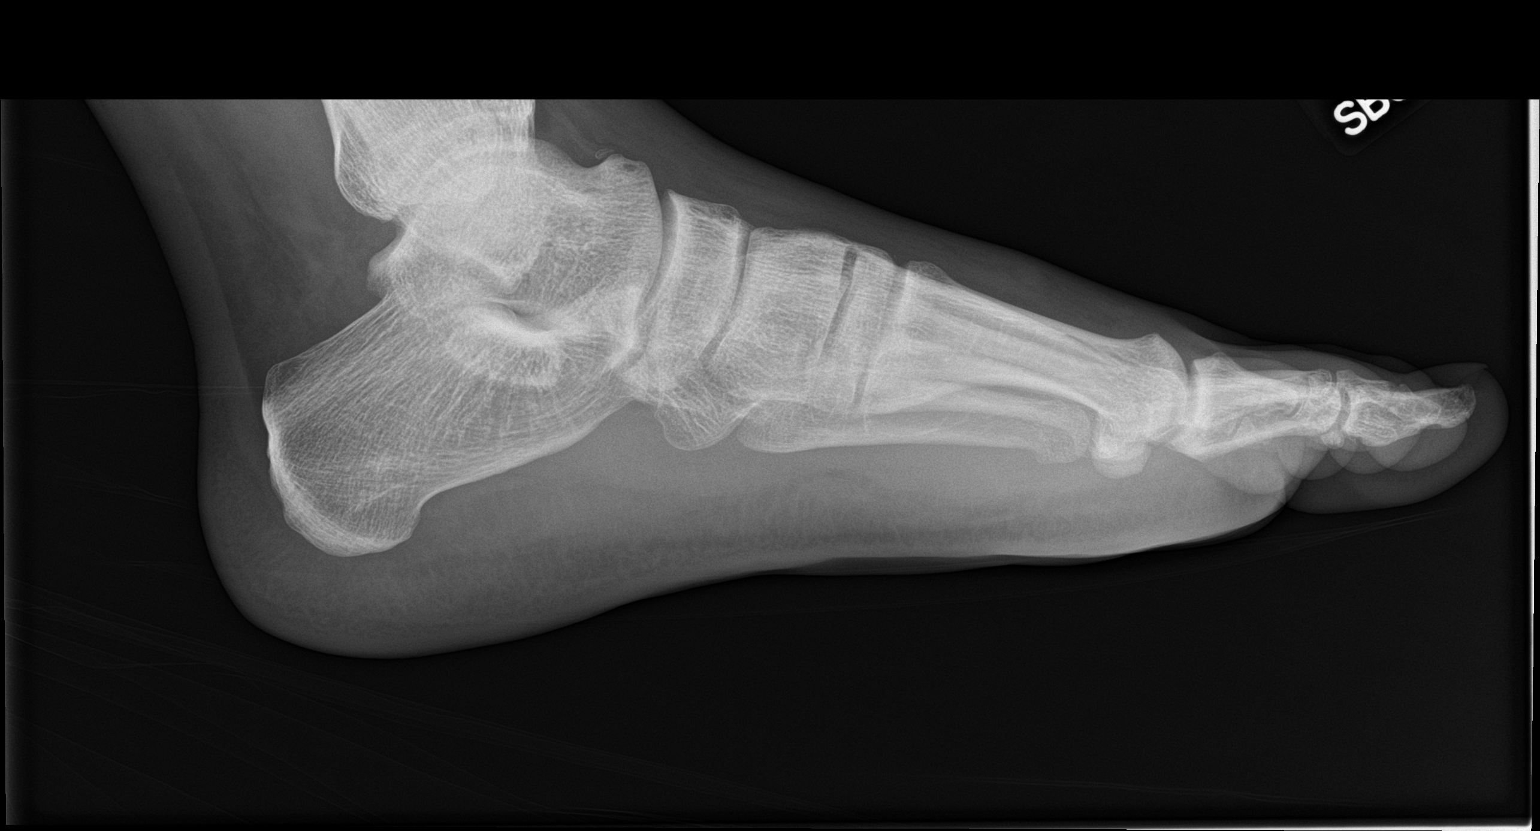

[3 of 3 positions shown; findings below may reference images not displayed]

FINDINGS: There is no evidence of fracture or dislocation. Old ununited
ossicle over the talus. There is no evidence of arthropathy or other
focal bone abnormality. Soft tissues are unremarkable.
IMPRESSION: Negative.

## 2017-10-21 ENCOUNTER — Emergency Department (HOSPITAL_COMMUNITY)
Admission: EM | Admit: 2017-10-21 | Discharge: 2017-10-21 | Disposition: A | Discharge status: Home / Self Care | Payer: BLUE CROSS/BLUE SHIELD | Attending: Emergency Medicine | Admitting: Emergency Medicine

## 2017-10-21 ENCOUNTER — Other Ambulatory Visit: Payer: Self-pay

## 2017-10-21 ENCOUNTER — Encounter (HOSPITAL_COMMUNITY): Payer: Self-pay | Admitting: Emergency Medicine

## 2017-10-21 DIAGNOSIS — Z79899 Other long term (current) drug therapy: Secondary | ICD-10-CM | POA: Insufficient documentation

## 2017-10-21 DIAGNOSIS — F17210 Nicotine dependence, cigarettes, uncomplicated: Secondary | ICD-10-CM | POA: Insufficient documentation

## 2017-10-21 DIAGNOSIS — Z202 Contact with and (suspected) exposure to infections with a predominantly sexual mode of transmission: Secondary | ICD-10-CM | POA: Insufficient documentation

## 2017-10-21 LAB — URINALYSIS, ROUTINE W REFLEX MICROSCOPIC
BILIRUBIN URINE: NEGATIVE
Bacteria, UA: NONE SEEN
Glucose, UA: NEGATIVE mg/dL
HGB URINE DIPSTICK: NEGATIVE
KETONES UR: 5 mg/dL — AB
NITRITE: NEGATIVE
Protein, ur: NEGATIVE mg/dL
Specific Gravity, Urine: 1.027 (ref 1.005–1.030)
Squamous Epithelial / LPF: NONE SEEN
pH: 6 (ref 5.0–8.0)

## 2017-10-21 LAB — HIV ANTIBODY (ROUTINE TESTING W REFLEX): HIV SCREEN 4TH GENERATION: NONREACTIVE

## 2017-10-21 LAB — RPR: RPR: NONREACTIVE

## 2017-10-21 MED ORDER — AZITHROMYCIN 250 MG PO TABS
1000.0000 mg | ORAL_TABLET | Freq: Once | ORAL | Status: AC
  Administered 2017-10-21: 1000 mg via ORAL
  Filled 2017-10-21: qty 4

## 2017-10-21 MED ORDER — CEFTRIAXONE SODIUM 250 MG IJ SOLR
250.0000 mg | Freq: Once | INTRAMUSCULAR | Status: AC
  Administered 2017-10-21: 250 mg via INTRAMUSCULAR
  Filled 2017-10-21: qty 250

## 2017-10-21 MED ORDER — LIDOCAINE HCL (PF) 1 % IJ SOLN
INTRAMUSCULAR | Status: AC
  Administered 2017-10-21: 2.1 mL
  Filled 2017-10-21: qty 5

## 2017-10-21 NOTE — ED Notes (Signed)
See EDP assessment 

## 2017-10-21 NOTE — ED Triage Notes (Signed)
Pt requesting to be treated for STD, denies symptoms, concerned for exposure.

## 2017-10-21 NOTE — Discharge Instructions (Signed)
°  You have been tested for HIV, syphilis, chlamydia and gonorrhea. These results will be available in approximately 3 days. You will be notified if they are positive.   Use a condom with every sexual encounter  Please return to the ER for new or worsening symptoms, any additional concerns.

## 2017-10-21 NOTE — ED Provider Notes (Signed)
[facility] EMERGENCY DEPARTMENT Provider Note   CSN: [id number] Arrival date & time: 10/21/17  0243     History   Chief Complaint Chief Complaint  Patient presents with  . Exposure to STD    HPI Corey Rivas is a 27 y.o. male.  The history is provided by the patient and medical records. No language interpreter was used.  Exposure to STD  Pertinent negatives include no abdominal pain.   Corey Rivas is an otherwise healthy 27 y.o. male who presents to the Emergency Department requesting STD check.  Patient states that he had unprotected intercourse about 27 hours ago.  He is experiencing symptoms, but just wanted to get everything checked out. He is very concerned that he may have caught an STD and would like to do anything he can to prevent this.  Denies penile lesions, discharge.  No fever or chills.  No abdominal pain, nausea, vomiting.  No change swelling.  History reviewed. No pertinent past medical history.  There are no active problems to display for this patient.   History reviewed. No pertinent surgical history.     Home Medications    Prior to Admission medications   Medication Sig Start Date End Date Taking? Authorizing Provider  albuterol (PROVENTIL HFA;VENTOLIN HFA) 108 (90 BASE) MCG/ACT inhaler Inhale 1 puff into the lungs every 6 (six) hours as needed for wheezing or shortness of breath.    [provider]  cetirizine-pseudoephedrine (ZYRTEC-D) 5-120 MG per tablet Take 1 tablet by mouth daily. 01/11/14   Gwyneth Sprout, MD  cyclobenzaprine (FLEXERIL) 10 MG tablet Take 1 tablet (10 mg total) by mouth 2 (two) times daily as needed for muscle spasms. 05/27/16   Joy, Shawn C, PA-C  ibuprofen (ADVIL,MOTRIN) 800 MG tablet Take 1 tablet (800 mg total) by mouth 3 (three) times daily. 01/16/14   Fayrene Helper, PA-C  naproxen (NAPROSYN) 500 MG tablet Take 1 tablet (500 mg total) by mouth 2 (two) times daily. 05/27/16   Joy, Shawn C, PA-C    ondansetron (ZOFRAN) 4 MG tablet Take 1 tablet (4 mg total) by mouth every 8 (eight) hours as needed for nausea or vomiting. 10/14/16   Trixie Dredge, PA-C  pantoprazole (PROTONIX) 20 MG tablet Take 1 tablet (20 mg total) by mouth daily. 10/14/16   Trixie Dredge, PA-C  sucralfate (CARAFATE) 1 g tablet Take 1 tablet (1 g total) by mouth 4 (four) times daily -  with meals and at bedtime. 10/14/16   Trixie Dredge, PA-C    Family History Family History  Problem Relation Age of Onset  . Diabetes Other   . Cancer Other   . CAD Other   . Stroke Other     Social History Social History   Tobacco Use  . Smoking status: Current Every Day Smoker    Types: Cigarettes  . Smokeless tobacco: Never Used  Substance Use Topics  . Alcohol use: Yes  . Drug use: Yes    Types: Marijuana     Allergies   Patient has no known allergies.   Review of Systems Review of Systems  Constitutional: Negative for chills and fever.  Gastrointestinal: Negative for abdominal pain, diarrhea, nausea and vomiting.  Genitourinary: Negative for difficulty urinating, discharge, dysuria, frequency, genital sores, penile pain, penile swelling, scrotal swelling, testicular pain and urgency.  Musculoskeletal: Negative for back pain.  Allergic/Immunologic: Negative for immunocompromised state.     Physical Exam Updated Vital Signs BP (!) 159/91 (BP Location: Left Arm)  Pulse 100   Temp 98.9 F (37.2 C) (Oral)   Resp 16   Ht 6' (1.829 m)   Wt 90.7 kg (200 lb)   SpO2 100%   BMI 27.12 kg/m   Physical Exam  Constitutional: He appears well-developed and well-nourished. No distress.  HENT:  Head: Normocephalic and atraumatic.  Neck: Neck supple.  Cardiovascular: Normal rate, regular rhythm and normal heart sounds.  No murmur heard. Pulmonary/Chest: Effort normal and breath sounds normal. No respiratory distress. He has no wheezes. He has no rales.  Abdominal: Soft. He exhibits no distension. There is no tenderness.   Genitourinary:  Genitourinary Comments: Chaperone present for exam. No discharge from penis. No signs of lesion or erythema on the penis or testicles. The penis and testicles are nontender. No testicular masses or swelling.  Neurological: He is alert.  Skin: Skin is warm and dry.  Nursing note and vitals reviewed.    ED Treatments / Results  Labs (all labs ordered are listed, but only abnormal results are displayed) Labs Reviewed  URINALYSIS, ROUTINE W REFLEX MICROSCOPIC - Abnormal; Notable for the following components:      Result Value   Ketones, ur 5 (*)    Leukocytes, UA TRACE (*)    All other components within normal limits  RPR  HIV ANTIBODY (ROUTINE TESTING)  GC/CHLAMYDIA PROBE AMP ([facility]    EKG  EKG Interpretation None       Radiology No results found.  Procedures Procedures (including critical care time)  Medications Ordered in ED Medications  cefTRIAXone (ROCEPHIN) injection 250 mg (not administered)  azithromycin (ZITHROMAX) tablet 1,000 mg (not administered)     Initial Impression / Assessment and Plan / ED Course  I have reviewed the triage vital signs and the nursing notes.  Pertinent labs & imaging results that were available during my care of the patient were reviewed by me and considered in my medical decision making (see chart for details).    Corey Rivas is a 27 y.o. male who presents to ED for STD check after having unprotected intercourse yesterday. Asymptomatic with benign exam. Would prefer prophylactic treatment which was given. HIV, RPR, G&C obtained. Patient aware that he will be notified if positive. All questions answered.    Final Clinical Impressions(s) / ED Diagnoses   Final diagnoses:  Possible exposure to STD    ED Discharge Orders    None       Christropher Gintz, Chase PicketJaime Pilcher, PA-C 10/21/17 [phone]    Derwood KaplanNanavati, Ankit, MD 10/21/17 [phone]

## 2017-10-24 NOTE — ED Notes (Signed)
10/24/2017.,  Pt. Called for results of his STD testing.  Only 2 have resulted, reviewed those with pt. And explained to pt. That I will call if he has any that is positive.  I also informed him that he can also call me back tomorrow to check.  Pt. Verbalized understanding.

## 2020-10-18 ENCOUNTER — Other Ambulatory Visit: Payer: Self-pay

## 2020-10-18 ENCOUNTER — Encounter (HOSPITAL_COMMUNITY): Payer: Self-pay

## 2020-10-18 DIAGNOSIS — Z5321 Procedure and treatment not carried out due to patient leaving prior to being seen by health care provider: Secondary | ICD-10-CM | POA: Insufficient documentation

## 2020-10-18 DIAGNOSIS — A64 Unspecified sexually transmitted disease: Secondary | ICD-10-CM | POA: Insufficient documentation

## 2020-10-18 NOTE — ED Triage Notes (Signed)
Pt arrived via walk in requesting STD check. Denies any urinary issues, discharge or pain. States his SO had vaginal discharge and now he is concerned for STDs.

## 2020-10-18 NOTE — ED Notes (Signed)
Patient was called to reassess vital signs but no response. x1 

## 2020-10-19 ENCOUNTER — Emergency Department (HOSPITAL_COMMUNITY)
Admission: EM | Admit: 2020-10-19 | Discharge: 2020-10-19 | Disposition: A | Discharge status: Home / Self Care | Payer: Self-pay | Attending: Emergency Medicine | Admitting: Emergency Medicine

## 2020-10-19 NOTE — ED Notes (Signed)
Pt called 3x for room placement. Eloped from waiting area.  

## 2021-06-10 ENCOUNTER — Other Ambulatory Visit: Payer: Self-pay

## 2021-06-10 ENCOUNTER — Ambulatory Visit (HOSPITAL_COMMUNITY)
Admission: EM | Admit: 2021-06-10 | Discharge: 2021-06-10 | Disposition: A | Discharge status: Home / Self Care | Payer: No Payment, Other | Attending: Psychiatry | Admitting: Psychiatry

## 2021-06-10 DIAGNOSIS — F900 Attention-deficit hyperactivity disorder, predominantly inattentive type: Secondary | ICD-10-CM | POA: Insufficient documentation

## 2021-06-10 NOTE — Progress Notes (Signed)
Patient denies any SI, HI or A/V hallucinations.  No use of ETOH or other substances.  He says however that he has been "going through a lot of mental breakdowns."  He says he has a hard time keeping focused, feels lost and scattered.  Patient says he is currently homeless but is staying with a friend.  Pt says that he gets to feeling anxious and irritated.  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

## 2021-06-10 NOTE — Discharge Instructions (Addendum)

## 2021-06-10 NOTE — BH Assessment (Signed)
Patient was seen by Roselyn Bering, NP.  She encouraged patient to return on Monday for outpatient medication monitoring on 2nd floor of [facility].  This clinician provided him with the schedule for outpatient.

## 2021-06-10 NOTE — ED Provider Notes (Signed)
[facility] Medical Screening Exam  Patient Name: Corey Rivas MRN: [id number] Date of Evaluation: 06/10/21 Chief Complaint:  I want to start back on medication for adhd Diagnosis:  Final diagnoses:  Attention deficit hyperactivity disorder (ADHD), predominantly inattentive type    History of Present illness: Corey Rivas is a 30 y.o. single male AOx4 with a history of adhd presenting to the [facility] for a medication restart. Patient reports that he was diagnosed with adhd as a child. Patient reports that he was prescribed adderall about 14 years ago. Patient reports that he stopped taking the adderall after high school because he felt that he did not need it any longer. Patient stated that he did not like the way the medication made him feel flat or numb. Patient reports that he almost feel like himself until the medications wore off during the course of the day. 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 Patient reports that he has been having a difficult time trying to stay focused at work, unmotivated, and scattered in his thoughts. Patient denies any alcohol use or any illicit substances. Discussed with patient that it has been a long time since he has had an evaluation for adhd and new assessment would be beneficial to determine most appropriate medication. Patient denies any SI/HI/AVH.  Psychiatric Specialty Exam  Presentation  General Appearance:Appropriate for Environment  Eye Contact:Good  Speech:Clear and Coherent  Speech Volume:Normal  Handedness:Right   Mood and Affect  Mood:Euthymic  Affect:Appropriate   Thought Process  Thought Processes:Coherent  Descriptions of Associations:Intact  Orientation:Full (Time, Place and Person)  Thought Content:WDL    Hallucinations:None  Ideas of Reference:None  Suicidal Thoughts:No  Homicidal Thoughts:No   Sensorium  Memory:Immediate Good; Recent Good; Remote  Good  Judgment:Good  Insight:Good   Executive Functions  Concentration:Good  Attention Span:Good  Recall:Good  Fund of Knowledge:Good  Language:Good   Psychomotor Activity  Psychomotor Activity:Normal   Assets  Assets:Communication Skills; Financial Resources/Insurance; Housing; Physical Health; Social Support; Transportation   Sleep  Sleep:Good  Number of hours: 7   No data recorded  Physical Exam: Physical Exam HENT:     Head: Normocephalic and atraumatic.     Nose: Nose normal.  Eyes:     General:        Right eye: No discharge.     Pupils: Pupils are equal, round, and reactive to light.  Cardiovascular:     Rate and Rhythm: Normal rate.  Pulmonary:     Effort: Pulmonary effort is normal.     Breath sounds: Rales present.  Abdominal:     General: Abdomen is flat.  Genitourinary:    Rectum: Guaiac result positive.  Musculoskeletal:        General: Normal range of motion.     Cervical back: Normal range of motion.  Skin:    General: Skin is warm.  Neurological:     Mental Status: He is alert and oriented to person, place, and time.  Psychiatric:        Mood and Affect: Mood normal.   Review of Systems  Constitutional: Negative.   HENT: Negative.    Eyes: Negative.   Respiratory: Negative.    Cardiovascular: Negative.   Gastrointestinal: Negative.   Genitourinary: Negative.   Musculoskeletal: Negative.   Skin: Negative.   Neurological: Negative.   Endo/Heme/Allergies: Negative.   Psychiatric/Behavioral: Negative.    Blood pressure (!) 119/93, pulse 95, temperature 99.1 F (37.3 C), temperature source Oral, resp. rate 18, SpO2 97 %.  There is no height or weight on file to calculate BMI.  Musculoskeletal: Strength & Muscle Tone: within normal limits Gait & Station: normal Patient leans: Right   [facility] MSE Discharge Disposition for Follow up and Recommendations: Based on my evaluation the patient does not appear to have an emergency  medical condition and can be discharged with resources and follow up care in outpatient services for Medication Management at [facility] services.    Jasper Riling, NP 06/10/2021, 11:23 PM

## 2022-06-27 ENCOUNTER — Encounter (HOSPITAL_COMMUNITY): Payer: Self-pay | Admitting: Emergency Medicine

## 2022-06-27 ENCOUNTER — Emergency Department (HOSPITAL_COMMUNITY)
Admission: EM | Admit: 2022-06-27 | Discharge: 2022-06-27 | Disposition: A | Discharge status: Home / Self Care | Payer: Self-pay | Attending: Emergency Medicine | Admitting: Emergency Medicine

## 2022-06-27 ENCOUNTER — Other Ambulatory Visit: Payer: Self-pay

## 2022-06-27 DIAGNOSIS — Z202 Contact with and (suspected) exposure to infections with a predominantly sexual mode of transmission: Secondary | ICD-10-CM | POA: Insufficient documentation

## 2022-06-27 DIAGNOSIS — R300 Dysuria: Secondary | ICD-10-CM | POA: Insufficient documentation

## 2022-06-27 DIAGNOSIS — R360 Urethral discharge without blood: Secondary | ICD-10-CM | POA: Insufficient documentation

## 2022-06-27 MED ORDER — CEFTRIAXONE SODIUM 500 MG IJ SOLR
500.0000 mg | Freq: Once | INTRAMUSCULAR | Status: AC
  Administered 2022-06-27: 500 mg via INTRAMUSCULAR
  Filled 2022-06-27: qty 500

## 2022-06-27 MED ORDER — LIDOCAINE HCL (PF) 1 % IJ SOLN
5.0000 mL | Freq: Once | INTRAMUSCULAR | Status: AC
  Administered 2022-06-27: 5 mL
  Filled 2022-06-27: qty 5

## 2022-06-27 MED ORDER — AZITHROMYCIN 500 MG PO TABS
1000.0000 mg | ORAL_TABLET | Freq: Once | ORAL | Status: AC
  Administered 2022-06-27: 1000 mg via ORAL
  Filled 2022-06-27 (×2): qty 2

## 2022-06-27 NOTE — ED Triage Notes (Signed)
Patient was called today and states that [facility] tested him and he has gonorrhea.  Patient states that he has a new partner.  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

## 2022-06-27 NOTE — ED Provider Notes (Signed)
[facility] EMERGENCY DEPARTMENT Provider Note   CSN: [id number] Arrival date & time: 06/27/22  0105     History  Chief Complaint  Patient presents with   SEXUALLY TRANSMITTED DISEASE    Corey Rivas is a 32 y.o. male.  The history is provided by the patient and medical records.    31 y.o. M presenting to the ED for STD treatment.  States he was tested at the [facility] and notified 2 days ago that he was + for gonorrhea.  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 He does report some dysuria and penile discharge.  Partner also aware and is being treated too.  Home Medications Prior to Admission medications   Medication Sig Start Date End Date Taking? Authorizing Provider  albuterol (PROVENTIL HFA;VENTOLIN HFA) 108 (90 BASE) MCG/ACT inhaler Inhale 1 puff into the lungs every 6 (six) hours as needed for wheezing or shortness of breath.    [provider]  cetirizine-pseudoephedrine (ZYRTEC-D) 5-120 MG per tablet Take 1 tablet by mouth daily. 01/11/14   Gwyneth Sprout, MD  cyclobenzaprine (FLEXERIL) 10 MG tablet Take 1 tablet (10 mg total) by mouth 2 (two) times daily as needed for muscle spasms. 05/27/16   Joy, Shawn C, PA-C  ibuprofen (ADVIL,MOTRIN) 800 MG tablet Take 1 tablet (800 mg total) by mouth 3 (three) times daily. 01/16/14   Fayrene Helper, PA-C  naproxen (NAPROSYN) 500 MG tablet Take 1 tablet (500 mg total) by mouth 2 (two) times daily. 05/27/16   Joy, Shawn C, PA-C  ondansetron (ZOFRAN) 4 MG tablet Take 1 tablet (4 mg total) by mouth every 8 (eight) hours as needed for nausea or vomiting. 10/14/16   Trixie Dredge, PA-C  pantoprazole (PROTONIX) 20 MG tablet Take 1 tablet (20 mg total) by mouth daily. 10/14/16   Trixie Dredge, PA-C  sucralfate (CARAFATE) 1 g tablet Take 1 tablet (1 g total) by mouth 4 (four) times daily -  with meals and at bedtime. 10/14/16   Trixie Dredge, PA-C      Allergies    Patient has no known allergies.    Review of  Systems   Review of Systems  Genitourinary:  Positive for dysuria.  All other systems reviewed and are negative.   Physical Exam Updated Vital Signs BP (!) 144/94 (BP Location: Right Arm)   Pulse (!) 121   Temp 98.5 F (36.9 C) (Oral)   Resp 18   SpO2 95%   Physical Exam Vitals and nursing note reviewed.  Constitutional:      Appearance: He is well-developed.  HENT:     Head: Normocephalic and atraumatic.  Eyes:     Conjunctiva/sclera: Conjunctivae normal.     Pupils: Pupils are equal, round, and reactive to light.  Cardiovascular:     Rate and Rhythm: Normal rate and regular rhythm.     Heart sounds: Normal heart sounds.  Pulmonary:     Effort: Pulmonary effort is normal.     Breath sounds: Normal breath sounds.  Abdominal:     General: Bowel sounds are normal.     Palpations: Abdomen is soft.  Genitourinary:    Comments: Genital exam deferred Musculoskeletal:        General: Normal range of motion.     Cervical back: Normal range of motion.  Skin:    General: Skin is warm and dry.  Neurological:     Mental Status: He is alert and oriented to person, place,  and time.     ED Results / Procedures / Treatments   Labs (all labs ordered are listed, but only abnormal results are displayed) Labs Reviewed - No data to display  EKG None  Radiology No results found.  Procedures Procedures    Medications Ordered in ED Medications  cefTRIAXone (ROCEPHIN) injection 500 mg (has no administration in time range)  azithromycin (ZITHROMAX) tablet 1,000 mg (has no administration in time range)  lidocaine (PF) (XYLOCAINE) 1 % injection 5 mL (has no administration in time range)    ED Course/ Medical Decision Making/ A&P                           Medical Decision Making Risk Prescription drug management.   31 y.o. M here after testing + for gonorrhea 2 days ago.  Partner aware and is being treated too.  Reports some dysuria and discharge.  Treated here with  rocephin/azithromycin.  Recommended abstinence for at least 7-10 days until medications take effect.  Can return here for new concerns.  Final Clinical Impression(s) / ED Diagnoses Final diagnoses:  STD exposure    Rx / DC Orders ED Discharge Orders     None         Larene Pickett, PA-C 06/27/22 0132    Ripley Fraise, MD 06/27/22 [phone]

## 2022-06-27 NOTE — Discharge Instructions (Signed)
Abstinence for 7-10 days to let medicine take effect. Return here for new concerns.

## 2022-12-23 NOTE — ED Provider Notes (Signed)
 [facility] EMERGENCY DEPT [address] [place] [address] Dept: [phone] Loc Appt: [phone] Loc: [phone]        Provider H&P CC: Inguinal pain  HPI:  Corey Rivas is a 32 y.o. male comes to the ER complaining of having some bilateral inguinal pain over the past 2 days . Pt states that he is fairly certain that this pain is stemming from having unprotected sex with his male partner recently. He denies any genital lesions or purulent d/c. Symptoms worsened by: nothing Symptoms improved by:  nothing Associated sxs: none Treatments tried at home prior to arrival: none History of similar symptoms in the past: no PMD: none  ROS: (-) fever, (-) cold sxs and coughing, (-) nausea, (-) vomiting, (-) headache, (-) dizziness, (-) back pain, (-) rash, (-) orthopnea, (-) chest pain , (-)abdominal pain, (-) leg pain, (-) diaphoresis, (-) sore throat, (-) arm pain, (-)palpitations, (-) dysuria, (-) LOC, (-) precipitating emotional event, (-) bleeding, all other systems negative except as noted above. _______________________________________________________________________  No past medical history on file.   No past surgical history on file. 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 ________________________________________________________________________  PHYSICAL EXAM: GENERAL APPEARANCE: well nourished, alert, cooperative, no obvious discomfort. VITALS (BP_P_R_T): See Nurses Note. EYES: PERRLA, EOMI, conjunctiva clear. NOSE: no nasal discharge. THROAT: Mucosa is pink, and without lesions.  Pharynx is without swelling or exudate MOUTH: (no) decreased moisture. NECK: supple, no neck tenderness, (-) thyromegally. CHEST WALL: (no) chest tenderness. EXTREMITIES: good pulses in all extremities,   (no)  swelling, (no) tenderness, or (no) redness SKIN: warm, dry, good color, (-) rash. NEURO: motor intact, sensory intact,  MENTAL STATUS: speech clear, oriented X 3, normal affect, responds appropriately to questions. Abd: no abdominal pain, no genital lesions, no penile d/c, no testicular lumps or tenderness, scrotum wnL  ________________________________________________________________________  DIFFERENTIAL Dx: Gc, chlamydia, hsv, tric, hernia, appy, uti  DATA REVIEWED: (+)Reviewed Nursing Triage Note Pulse Ox: 96 % room air - interpreted as normal Patient Vitals for the past 24 hrs:  BP Temp Resp SpO2 Height Weight  12/23/22 2220 -- -- -- -- 6' 1 (1.854 m) 104.3 kg (230 lb)  12/23/22 2040 163/100 98.9 F (37.2 C) 16 96 % -- --       Results for orders placed or performed during the hospital encounter of 12/23/22  URINALYSIS POC (LAB)  Result Value Ref Range   Urine pH 6.0 5.0 - 8.0 pH   Urine Protein Screen Trace (A) Negative mg/dL   Urine Glucose Negative Negative mg/dL   Urine Ketones Negative Negative mg/dL   Urine Occult Blood Negative Negative   Urine Specific Gravity >=1.030 1.003 - 1.030   Urine Nitrite Negative Negative   Urine Leukocyte Esterase Negative Negative   Urine Bilirubin Small (A) Negative   Urine Urobilinogen 0.2 0.2 - 1.0 mg/dL   No orders to display    Medication Orders this Encounter  Medications  . doxycycline (MONODOX) capsule 100 mg  . cefTRIAXone  (Rocephin ) 250 mg in lidocaine  (Xylocaine ) 1 mL syringe  . Doxycycline Hyclate 100 mg PO CAPS    Sig: Take one tablet by mouth twice a day for the following week    Dispense:  14 Cap    Refill:  0    ED Course / Medical Decision Making:    I have referred pt to  the local health dept. For further std testing if he is still concerned about hiv and hepatitiis  Re-Evaluation:   Upon final evaluation and prior to discharge home, patient states that they are feeling better and is ready to go home.   I  have explained that if they are to have any change in their status, they should return to the ER immediately for re-evaluation.    ________________________________________________________________________ Disposition: HOME Condition: STABLE  (Z20.2) Exposure to sexually transmitted disease (STD)  New Prescriptions   DOXYCYCLINE HYCLATE 100 MG PO CAPS    Take one tablet by mouth twice a day for the following week    Fernand GORMAN Millers, NP

## 2022-12-26 NOTE — ED Provider Notes (Signed)
  No Known Allergies  No current facility-administered medications for this encounter.    Discharge Medication List as of 12/23/2022 10:16 PM     START taking these medications   Details  Doxycycline Hyclate 100 mg PO CAPS Take one tablet by mouth twice a day for the following week, Disp-14 Cap, R-0, DAW, Print       Corey Rivas was seen in the ER  I have reviewed their culture which shows   No results found for this visit on 12/23/22. No orders to display   Results for orders placed or performed during the hospital encounter of 12/23/22  URINALYSIS POC (LAB)  Result Value Ref Range   Urine pH 6.0 5.0 - 8.0 pH   Urine Protein Screen Trace (A) Negative mg/dL   Urine Glucose Negative Negative mg/dL   Urine Ketones Negative Negative mg/dL   Urine Occult Blood Negative Negative   Urine Specific Gravity >=1.030 1.003 - 1.030   Urine Nitrite Negative Negative   Urine Leukocyte Esterase Negative Negative   Urine Bilirubin Small (A) Negative   Urine Urobilinogen 0.2 0.2 - 1.0 mg/dL  Chlam / GC / Trich Nucl Amp, Urine  Result Value Ref Range   Trichomonas Nuc Amp-Urine Positive (A) Negative   Chlamydia Amplified Urine Negative Negative   GC Amplified Urine Negative Negative     The patient was treated with IV Ceftriaxone  and Docycline for STI prophylaxis upon discharge.  Patient tested positive for Trichomonas  I have contacted the patient 9:54 AM and informed them of the results. I have called in a prescription for Metronidazole to [facility] in [place]   This patient has been instructed on the need for follow up as well as partner testing and treatment.    [name], PA

## 2024-02-11 NOTE — ED Provider Notes (Signed)
 ------------------------------------------------------------------------------- Attestation signed by Julien KATHEE Haddock, MD at 02/11/24 [phone] I have reviewed and agree with the APP's findings and plan for this patient. Josie B Bowen, MD Emergency Department - 02/11/2024 6:33 PM -------------------------------------------------------------------------------  Medical screening initiated and orders placed by Natalie A Hoidal, PA-C. Based on brief HPI and physical exam documented above, further evaluation and treatment are required in the main Emergency Department. Patient is stable and will placed in the waiting/triage area under RN's supervision until a treatment room comes available. Patient was advised of the importance of staying to complete the emergency medical evaluation and any needed therapeutic treatments. 02/11/2024 / 2:47 PM   HPI: 33y.o. M presents to the ED with a c/c of left facial/maxillary sinus pain and swelling. PE: GCS 15, ambulatory, NAD, unlabored respirations, skin warm and dry  DDx: sinusitis, cellulitis, abscess   [facility]  ED Provider Note  Willaim Mode Klus 33 y.o. male DOB: 11/22/1990 MRN: [id number] History   Chief Complaint  Patient presents with  . Mouth Pain    Reports he thinks he has a sinus infection due feeling like his face feels swollen around his nose. Reports he thinks there is a bump that it is irritated in his nose   Patient present with swelling to left side of face,  states noted a bump to  left nose several days ago and has been squeezing it and it drains at times,  this morning face became swollen,  no trouble swallowing or eating,  no fever.    History provided by:  Patient Language interpreter used: No        No past medical history on file.  No past surgical history on file.  Social History   Substance and Sexual Activity  Alcohol Use Not on file   Social History   Tobacco Use  Smoking Status Some  Days  . Types: Cigarettes  Smokeless Tobacco Never   E-Cigarettes  . Vaping Use    . Start Date    . Cartridges/Day    . Quit Date     Social History   Substance and Sexual Activity  Drug Use Not on file         No Known Allergies  Home Medications   No medications on file    Primary Survey  Primary Survey  Review of Systems   Review of Systems  Constitutional:  Negative for activity change, appetite change, fatigue and fever.  HENT:  Positive for sinus pain. Negative for ear discharge, ear pain and sore throat.   Respiratory:  Negative for cough and shortness of breath.   Cardiovascular:  Negative for chest pain.  Gastrointestinal:  Negative for abdominal pain.  Skin:  Positive for rash and wound.  Allergic/Immunologic: Negative for immunocompromised state.    Physical Exam   ED Triage Vitals [02/11/24 1443]  BP 140/70  Heart Rate 81  Resp 16  SpO2 97 %  Temp 98.3 F (36.8 C)    Physical Exam  Nursing note and vitals reviewed. Constitutional: He appears well-developed and well-nourished.  HENT:  Head: Atraumatic.  Nose: Nasal tenderness.    Mouth/Throat: Voice normal.  Edema and induration of left cheek adjacent to nose and just below the nose.  No drainage or erythema.    Eyes: EOM are intact. Pupils are equal, round, and reactive to light.  Neck: Normal range of motion and voice normal. Neck supple.  Cardiovascular: Normal rate, regular rhythm, normal heart sounds and intact  distal pulses.  Pulmonary/Chest: Respiratory effort normal and breath sounds normal.  Musculoskeletal: Normal range of motion.     Cervical back: Normal range of motion and neck supple.   Neurological: He is alert and oriented to person, place, and time.  Skin: Skin is warm. Skin is dry.  Psychiatric: He has a normal mood and affect.     ED Course   Lab results:   CBC AND DIFFERENTIAL - Abnormal      Result Value   WBC 10.4     RBC 4.90     HGB 15.5     HCT 45.6      MCV 93.1 (*)    MCH 31.6     MCHC 34.0     Plt Ct 245     RDW SD 45.1     MPV 9.7     NRBC% 0.0     Absolute NRBC Count 0.00     NEUTROPHIL % 63.4     LYMPHOCYTE % 22.9     MONOCYTE % 8.8     Eosinophil % 4.3     BASOPHIL % 0.4     IG% 0.2     ABSOLUTE NEUTROPHIL COUNT 6.59     ABSOLUTE LYMPHOCYTE COUNT 2.38     Absolute Monocyte Count 0.92 (*)    Absolute Eosinophil Count 0.45     Absolute Basophil Count 0.04     Absolute Immature Granulocyte Count 0.02    COMPREHENSIVE METABOLIC PANEL - Abnormal   Na 137     Potassium 3.9     Cl 101     CO2 25     AGAP 11     Glucose 93     BUN 9     Creatinine 1.15     Ca 9.7     ALK PHOS 72     T Bili 0.5     Total Protein 7.6     Alb 4.6     GLOBULIN 3.0     ALBUMIN/GLOBULIN RATIO 1.5     BUN/CREAT RATIO 7.8 (*)    ALT 16     AST 14     eGFR 87     Comment: Normal GFR (glomerular filtration rate) > 60 mL/min/1.73 meters squared, < 60 may include impaired kidney function. Calculation based on the Chronic Kidney Disease Epidemiology Collaboration (CK-EPI)equation refit without adjustment for race.    Imaging:   CT FACIAL BONES W CONTRAST   Narrative:    PROCEDURE:CT FACIAL BONES W CONTRAST  TECHNIQUE: Multiple axial CT images of the face were acquired after the administration of    75 mL of IOPAMIDOL 76 % IV SOLN intravenous contrast. Coronal and sagittal images were obtained. CT dose reduction techniques utilized.  PROVIDED CLINICAL INDICATION: left facial swelling and maxillary sinus pain ADDITIONAL CLINICAL INDICATION: None available   COMPARISON:  None available  FINDINGS:  Fat stranding, skin thickening, and platysmal thickening in the left maxillary and mandibular regions.  No drainable fluid collection is identified.  No significant periodontal disease is identified in the region.  A small dental caries is present within the most posterior left maxillary molar.  Mildly enlarged left submandibular lymph node  measuring 17 mm long axis.  The visualized aerodigestive tract is patent without asymmetric soft tissue.  The visualized paranasal sinuses are without significant mucosal thickening.  The visualized mastoid air cells are clear.  The parotid, submandibular glands are unremarkable.    Impression:    IMPRESSION:  Nonspecific inflammatory change in the left maxillary and mandibular soft tissues.  May relate to cellulitis.  No drainable fluid collection identified.  Mildly enlarged left mandibular lymph node.  Electronically Signed by: Curtistine CHRISTELLA Settler, MD on 02/11/2024 4:51 PM      ECG: ECG Results   None                                                                     Pre-Sedation Procedures    Medical Decision Making The differential diagnosis associated with the patient's presentation includes: abscess,  cellulitis,  dental infection,  patient felt improved in ED,  remains well appearing,  ambulatory w/o respirator distress,  CT consistent with cellulitis w/o focal collection,  labs stable,  IV abx,  ENT follow up and return precautions given.               Amount and/or Complexity of Data Reviewed Labs: ordered. Decision-making details documented in ED Course. Radiology: ordered. Decision-making details documented in ED Course.  Risk OTC drugs. Prescription drug management.      In reviewing the patient's old records, I reviewed their prior controlled substances prescriptions, using the PDMP.    Provider Communication  New Prescriptions   CLINDAMYCIN (CLEOCIN) 150 MG CAPSULE    Take two capsules (300 mg dose) by mouth 3 (three) times a day for 10 days.      Quantity: 60 capsule    Refills: 0   HYDROCODONE -ACETAMINOPHEN  (NORCO) 5-325 MG PER TABLET    Take one tablet to two tablets by mouth every 6 (six) hours as needed for up to 3 days. Max Daily Amount: 8 tablets      Quantity: 10 tablet    Refills: 0   MUPIROCIN  (BACTROBAN) 2 % OINTMENT    Apply topically 3 (three) times a day for 7 days.      Quantity: 22 g    Refills: 0    Modified Medications   No medications on file    Discontinued Medications   No medications on file    Clinical Impression Final diagnoses:  Cellulitis of face  Facial swelling    ED Disposition     ED Disposition  Discharge   Condition  Stable   Comment  --                 Follow-up Information     Toribio JONELLE Cone, MD. Schedule an appointment as soon as possible for a visit in 3 days.   Specialties: Otolaryngology Surgery , Otolaryngology Comments: Return if worse or concerns Contact information: [address] [place] [address] [phone]         [facility] Emergency Department.   Specialty: Emergency Medicine Comments: Return if worse or concerns Contact information: [address] [place]  [address] [phone]                 Electronically signed by:    Sari GORMAN Fore, PA-C 02/11/24 1820

## 2024-04-02 ENCOUNTER — Encounter (HOSPITAL_COMMUNITY): Payer: Self-pay | Admitting: *Deleted

## 2024-04-02 ENCOUNTER — Emergency Department (HOSPITAL_COMMUNITY): Admission: EM | Admit: 2024-04-02 | Payer: Self-pay | Attending: Emergency Medicine | Admitting: Emergency Medicine

## 2024-04-02 ENCOUNTER — Emergency Department (HOSPITAL_COMMUNITY): Payer: Self-pay

## 2024-04-02 ENCOUNTER — Other Ambulatory Visit: Payer: Self-pay

## 2024-04-02 DIAGNOSIS — F19959 Other psychoactive substance use, unspecified with psychoactive substance-induced psychotic disorder, unspecified: Secondary | ICD-10-CM | POA: Diagnosis present

## 2024-04-02 DIAGNOSIS — F1910 Other psychoactive substance abuse, uncomplicated: Secondary | ICD-10-CM

## 2024-04-02 DIAGNOSIS — R55 Syncope and collapse: Secondary | ICD-10-CM | POA: Insufficient documentation

## 2024-04-02 DIAGNOSIS — F1210 Cannabis abuse, uncomplicated: Secondary | ICD-10-CM | POA: Insufficient documentation

## 2024-04-02 DIAGNOSIS — Z79899 Other long term (current) drug therapy: Secondary | ICD-10-CM | POA: Insufficient documentation

## 2024-04-02 DIAGNOSIS — F1410 Cocaine abuse, uncomplicated: Secondary | ICD-10-CM | POA: Insufficient documentation

## 2024-04-02 DIAGNOSIS — F29 Unspecified psychosis not due to a substance or known physiological condition: Secondary | ICD-10-CM | POA: Insufficient documentation

## 2024-04-02 DIAGNOSIS — F1914 Other psychoactive substance abuse with psychoactive substance-induced mood disorder: Secondary | ICD-10-CM | POA: Insufficient documentation

## 2024-04-02 DIAGNOSIS — F1510 Other stimulant abuse, uncomplicated: Secondary | ICD-10-CM | POA: Insufficient documentation

## 2024-04-02 LAB — COMPREHENSIVE METABOLIC PANEL WITH GFR
ALT: 12 U/L (ref 0–44)
AST: 19 U/L (ref 15–41)
Albumin: 3.9 g/dL (ref 3.5–5.0)
Alkaline Phosphatase: 47 U/L (ref 38–126)
Anion gap: 10 (ref 5–15)
BUN: 6 mg/dL (ref 6–20)
CO2: 24 mmol/L (ref 22–32)
Calcium: 9.3 mg/dL (ref 8.9–10.3)
Chloride: 104 mmol/L (ref 98–111)
Creatinine, Ser: 1.52 mg/dL — ABNORMAL HIGH (ref 0.61–1.24)
GFR, Estimated: >60 mL/min (ref >60)
Glucose, Bld: 116 mg/dL — ABNORMAL HIGH (ref 70–99)
Potassium: 3.5 mmol/L (ref 3.5–5.1)
Sodium: 138 mmol/L (ref 135–145)
Total Bilirubin: 0.8 mg/dL (ref 0.0–1.2)
Total Protein: 6.6 g/dL (ref 6.5–8.1)

## 2024-04-02 LAB — CBC
HCT: 43.1 % (ref 39.0–52.0)
Hemoglobin: 14.8 g/dL (ref 13.0–17.0)
MCH: 31.6 pg (ref 26.0–34.0)
MCHC: 34.3 g/dL (ref 30.0–36.0)
MCV: 92.1 fL (ref 80.0–100.0)
Platelets: 219 10*3/uL (ref 150–400)
RBC: 4.68 MIL/uL (ref 4.22–5.81)
RDW: 13.1 % (ref 11.5–15.5)
WBC: 8.7 10*3/uL (ref 4.0–10.5)
nRBC: 0 % (ref 0.0–0.2)

## 2024-04-02 LAB — MAGNESIUM: Magnesium: 2.2 mg/dL (ref 1.7–2.4)

## 2024-04-02 LAB — SALICYLATE LEVEL: Salicylate Lvl: <7 mg/dL — ABNORMAL LOW (ref 7.0–30.0)

## 2024-04-02 LAB — ETHANOL: Alcohol, Ethyl (B): <15 mg/dL (ref <15)

## 2024-04-02 LAB — ACETAMINOPHEN LEVEL: Acetaminophen (Tylenol), Serum: <10 ug/mL — ABNORMAL LOW (ref 10–30)

## 2024-04-02 LAB — CBG MONITORING, ED: Glucose-Capillary: 200 mg/dL — ABNORMAL HIGH (ref 70–99)

## 2024-04-02 MED ORDER — ZIPRASIDONE MESYLATE 20 MG IM SOLR
20.0000 mg | Freq: Once | INTRAMUSCULAR | Status: DC
  Filled 2024-04-02: qty 20

## 2024-04-02 MED ORDER — DIPHENHYDRAMINE HCL 50 MG/ML IJ SOLN
25.0000 mg | Freq: Once | INTRAMUSCULAR | Status: AC
  Administered 2024-04-02: 25 mg via INTRAMUSCULAR
  Filled 2024-04-02: qty 1

## 2024-04-02 MED ORDER — LORAZEPAM 2 MG/ML IJ SOLN
INTRAMUSCULAR | Status: AC
  Filled 2024-04-02: qty 1

## 2024-04-02 MED ORDER — SODIUM CHLORIDE 0.9 % IV BOLUS: 1000.0000 mL | Freq: Once | INTRAVENOUS | Status: DC

## 2024-04-02 MED ORDER — HALOPERIDOL LACTATE 5 MG/ML IJ SOLN
5.0000 mg | Freq: Once | INTRAMUSCULAR | Status: AC
  Administered 2024-04-02: 5 mg via INTRAMUSCULAR
  Filled 2024-04-02: qty 1

## 2024-04-02 MED ORDER — LORAZEPAM 2 MG/ML IJ SOLN
2.0000 mg | Freq: Once | INTRAMUSCULAR | Status: AC
  Administered 2024-04-02: 2 mg via INTRAMUSCULAR

## 2024-04-02 MED ORDER — OLANZAPINE 10 MG PO TABS
10.0000 mg | ORAL_TABLET | Freq: Once | ORAL | Status: AC
  Administered 2024-04-02: 10 mg via ORAL
  Filled 2024-04-02: qty 1

## 2024-04-02 MED ORDER — STERILE WATER FOR INJECTION IJ SOLN
INTRAMUSCULAR | Status: AC
  Filled 2024-04-02: qty 10

## 2024-04-02 NOTE — ED Notes (Signed)
 Patient asked multiple times to go/stay in his room, but patient refuses and remains standing in the hallway.

## 2024-04-02 NOTE — ED Notes (Addendum)
 Patient actively pacing and refusing to go into his room in fear that someone is after him to kill him. Security with patient at this time.

## 2024-04-02 NOTE — ED Notes (Signed)
 Another patient informed secretary that this patient tried to get the other patient to come into the bathroom with him while he was actively using the bathroom.

## 2024-04-02 NOTE — ED Notes (Signed)
 ED Provider at bedside.

## 2024-04-02 NOTE — ED Notes (Addendum)
Belongings placed in locker number 3 

## 2024-04-02 NOTE — ED Notes (Signed)
 Patient placed in restraints after multiple attempts to redirect and verbally de-escalate patient. Patient proceeded to push security leading to having to be restrained by security and then placed in tough restraints.

## 2024-04-02 NOTE — ED Notes (Signed)
 Pt brought to purple zone 51 at this time. Pt arrives in 4 point restraints. Pt arrives on ED stretcher, snoring and sound asleep. Pt is arousable to painful stimuli. 4 point restraints removed once patient in room safely. No sitter arrives with patient.

## 2024-04-02 NOTE — ED Notes (Signed)
 IVC paperwork in progress. Magistrate contacted to verify if IVC has been accepted.  Per Magistrate on duty, there was no pending IVC's and we are waiting on the findings and custody to be delivered by [employer].

## 2024-04-02 NOTE — ED Provider Triage Note (Signed)
 Emergency Medicine Provider Triage Evaluation Note  Harvel Meskill , a 33 y.o. male  was evaluated in triage.  Pt complains of near syncope.  Patient reports feeling of weakness and almost passed out in the heat earlier today.  Please pick them up noted him to be diaphoretic.  Patient also mentions that people are after me and no one believes me.   Review of Systems  Positive: As above Negative: As above  Physical Exam  BP (!) 159/115   Pulse (!) 141   Temp 98.3 F (36.8 C)   Resp 18   SpO2 98%  General: Awake. Alert. No acute distress.  Anxious pacing Cardiac: Tachycardic Lungs: Clear to auscultation bilaterally Psych: Calm and cooperative  Medical Decision Making  Medically screening exam initiated at 10:50 AM.  Appropriate orders placed.  Damar Lipa was informed that the remainder of the evaluation will be completed by another provider, this initial triage assessment does not replace that evaluation, and the importance of remaining in the ED until their evaluation is complete.     Pamella Ozell LABOR, DO 04/02/24 1052

## 2024-04-02 NOTE — ED Notes (Signed)
Sitter arrived to sit with patient.  

## 2024-04-02 NOTE — ED Notes (Signed)
 IVC PAPERWORK HAVE BEEN FILED

## 2024-04-02 NOTE — ED Notes (Signed)
 Spoke with patient, patient states he fears for his life. Patient states that there are several men in several cars that have been following him. Patient is speaking in a very quiet soft voice and requested that I also speak in that tone of voice. Patient appears to be very paranoid.

## 2024-04-02 NOTE — ED Notes (Signed)
 IVC PAPER HAVE BEEN FILED

## 2024-04-02 NOTE — ED Triage Notes (Signed)
 Pt tells me that he is experiencing homelessness and he became faint and has been feeling like he was going to faint.  Pt notes that he feels he got overheated.  He is diaphoretic and his clothing appears drenched in sweat.

## 2024-04-02 NOTE — BH Assessment (Signed)
 Patient was deferred to [facility] for a telepsych assessment. The assigned care coordinator will provide updates regarding the scheduling of the assessment. IRIS coordinator can be reached at [phone] for further information on the timing of the telepsych evaluation.

## 2024-04-02 NOTE — ED Notes (Signed)
 Paper work in purple zone

## 2024-04-02 NOTE — ED Notes (Signed)
 Original copy,Red magistrate folder, 3 Copies on blue Clipboard, 1Copy  with patients labels in medical records drawer

## 2024-04-02 NOTE — ED Notes (Signed)
 [phone] case number

## 2024-04-02 NOTE — ED Provider Notes (Signed)
 [place] EMERGENCY DEPARTMENT AT [facility] Provider Note   CSN: [id number] Arrival date & time: 04/02/24  9044     Patient presents with: Near Syncope   Corey Rivas is a 33 y.o. male.   Corey Rivas is a 33 y.o. male with no prior medical history, who presents to the ED via police.  He reports that he was running because he felt like someone was chasing him and then started to feel weak as though he was going to faint but did not pass out.  He notes feeling like he was overheated and he reports he has been running and running because people are after him.  Noted to be diaphoretic in triage.  Patient reports people have been after him for the past 2 months but it has been worse recently.  He denies suicidal ideations but does report that he wants to hurt people that are after him.  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   Near Syncope       Prior to Admission medications   Medication Sig Start Date End Date Taking? Authorizing Provider  albuterol  (PROVENTIL  HFA;VENTOLIN  HFA) 108 (90 BASE) MCG/ACT inhaler Inhale 1 puff into the lungs every 6 (six) hours as needed for wheezing or shortness of breath.    [provider]  cetirizine -pseudoephedrine  (ZYRTEC -D) 5-120 MG per tablet Take 1 tablet by mouth daily. 01/11/14   Doretha Folks, MD  cyclobenzaprine  (FLEXERIL ) 10 MG tablet Take 1 tablet (10 mg total) by mouth 2 (two) times daily as needed for muscle spasms. 05/27/16   Joy, Shawn C, PA-C  ibuprofen  (ADVIL ,MOTRIN ) 800 MG tablet Take 1 tablet (800 mg total) by mouth 3 (three) times daily. 01/16/14   Nivia Colon, PA-C  naproxen  (NAPROSYN ) 500 MG tablet Take 1 tablet (500 mg total) by mouth 2 (two) times daily. 05/27/16   Joy, Shawn C, PA-C  ondansetron  (ZOFRAN ) 4 MG tablet Take 1 tablet (4 mg total) by mouth every 8 (eight) hours as needed for nausea or vomiting. 10/14/16   Devora Perkins, PA-C  pantoprazole  (PROTONIX ) 20 MG tablet Take 1 tablet (20 mg total) by mouth daily. 10/14/16   Devora Perkins, PA-C  sucralfate  (CARAFATE ) 1 g tablet Take 1 tablet (1 g total) by mouth 4 (four) times daily -  with meals and at bedtime. 10/14/16   Devora Perkins, PA-C    Allergies: Patient has no known allergies.    Review of Systems  Unable to perform ROS: Psychiatric disorder  Cardiovascular:  Positive for near-syncope.    Updated Vital Signs BP (!) 159/115   Pulse (!) 141   Temp 98.3 F (36.8 C)   Resp 18   SpO2 98%   Physical Exam Vitals and nursing note reviewed.  Constitutional:      General: He is not in acute distress.    Appearance: Normal appearance. He is well-developed. He is not diaphoretic.     Comments: Alert and agitated, but not in acute distress  HENT:     Head: Normocephalic and atraumatic.  Eyes:     General:        Right eye: No discharge.        Left eye: No discharge.     Pupils: Pupils are equal, round, and reactive to light.  Cardiovascular:     Rate and Rhythm: Normal rate and regular rhythm.     Pulses: Normal pulses.  Heart sounds: Normal heart sounds.  Pulmonary:     Effort: Pulmonary effort is normal. No respiratory distress.     Breath sounds: Normal breath sounds. No wheezing or rales.     Comments: Respirations equal and unlabored, patient able to speak in full sentences, lungs clear to auscultation bilaterally  Abdominal:     General: Bowel sounds are normal. There is no distension.     Palpations: Abdomen is soft. There is no mass.     Tenderness: There is no abdominal tenderness. There is no guarding.     Comments: Abdomen soft, nondistended, nontender to palpation in all quadrants without guarding or peritoneal signs  Musculoskeletal:        General: No deformity.     Cervical back: Neck supple.  Skin:    General: Skin is warm and dry.     Capillary Refill: Capillary refill takes less than 2 seconds.  Neurological:      Mental Status: He is alert and oriented to person, place, and time.     Coordination: Coordination normal.     Comments: Speech is clear, able to follow commands Moves extremities without ataxia, coordination intact  Psychiatric:        Mood and Affect: Mood normal.        Behavior: Behavior normal.     (all labs ordered are listed, but only abnormal results are displayed) Labs Reviewed  COMPREHENSIVE METABOLIC PANEL WITH GFR - Abnormal; Notable for the following components:      Result Value   Glucose, Bld 116 (*)    Creatinine, Ser 1.52 (*)    All other components within normal limits  ACETAMINOPHEN  LEVEL - Abnormal; Notable for the following components:   Acetaminophen  (Tylenol ), Serum <10 (*)    All other components within normal limits  SALICYLATE LEVEL - Abnormal; Notable for the following components:   Salicylate Lvl <7.0 (*)    All other components within normal limits  CBG MONITORING, ED - Abnormal; Notable for the following components:   Glucose-Capillary 200 (*)    All other components within normal limits  CBC  ETHANOL  URINALYSIS, ROUTINE W REFLEX MICROSCOPIC  RAPID URINE DRUG SCREEN, HOSP PERFORMED  MAGNESIUM  CBG MONITORING, ED    EKG: EKG Interpretation Date/Time:  Thursday April 02 2024 10:04:00 EDT Ventricular Rate:  146 PR Interval:  130 QRS Duration:  84 QT Interval:  292 QTC Calculation: 455 R Axis:   82  Text Interpretation: Sinus tachycardia Nonspecific ST abnormality Abnormal ECG When compared with ECG of 11-Jan-2014 12:07, PREVIOUS ECG IS PRESENT Confirmed by Bari Flank [phone]) on 04/02/2024 12:20:31 PM  Radiology: No results found.   Procedures   Medications Ordered in the ED  sodium chloride 0.9 % bolus 1,000 mL (1,000 mLs Intravenous Patient Refused/Not Given 04/02/24 1202)  sterile water (preservative free) injection (  Patient Refused/Not Given 04/02/24 1203)  OLANZapine (ZYPREXA) tablet 10 mg (10 mg Oral Given 04/02/24 1155)  LORazepam  (ATIVAN) injection 2 mg (2 mg Intramuscular Given 04/02/24 1236)  diphenhydrAMINE (BENADRYL) injection 25 mg (25 mg Intramuscular Given 04/02/24 1235)  haloperidol lactate (HALDOL) injection 5 mg (5 mg Intramuscular Given 04/02/24 1235)                                    Medical Decision Making Amount and/or Complexity of Data Reviewed Labs: ordered. Radiology: ordered.  Risk Prescription drug management.  Patient arrived agitated and acutely paranoid convinced that people are out to get him.  He was running when he started to feel faint and police brought him in.  No syncope.  Noted to be tachycardic and hypertensive on arrival.  Reports using meth yesterday.  No known history of mental illness.  Differential includes acute first break psychosis, methamphetamine induced psychosis, electrolyte derangement, intracranial abnormality.  Patient becoming increasingly agitated and uncooperative, placed under involuntary commitment, medicated initially with Zyprexa but despite this was persistently agitated and standing in the hallway.  Received Haldol, Ativan and Benadryl and was placed in 4 point restraints due to violent agitation.  Patient now sleeping.  Initial lab work overall reassuring, no leukocytosis and normal hemoglobin, no significant electrolyte derangement, minimal creatinine elevation of 1.5 to, normal LFTs, acetaminophen , salicylate and ethanol levels negative, urinalysis and UDS pending.  EKG with sinus tachycardia with normal QTc.  Head CT without acute intracranial abnormality.  At shift change care signed out to PA Norleen Dec, will follow-up on pending studies and reevaluate once chemical sedation has lightened and patient is awake and able to answer questions, then will likely need TTS consult.     Final diagnoses:  Psychosis, unspecified psychosis type [facility])    ED Discharge Orders     None          Alva Larraine FALCON, PA-C 04/02/24 1526    Bari Roxie HERO, DO 04/02/24 1530

## 2024-04-02 NOTE — ED Notes (Signed)
 Pt refused blood work. Pt changing into scrubs now.

## 2024-04-02 NOTE — ED Provider Notes (Signed)
  Physical Exam  BP (!) 101/59   Pulse 71   Temp 97.6 F (36.4 C) (Axillary)   Resp 20   SpO2 98%   Physical Exam  Procedures  Procedures  ED Course / MDM    Medical Decision Making Amount and/or Complexity of Data Reviewed Labs: ordered. Radiology: ordered.  Risk Prescription drug management.   Assumed care from LOIS Christen, PA-C.  This patient was brought in by law enforcement for acute delirium.  Extensive antipsychotic regimen and sedation given, therefore TTS consult was pending at time of handover as patient was sedated.  Plan at this time is to reassess patient, and place TTS consult when he is lucid.  Currently patient has had IVC placed, follow-up on urinalysis/UDS.  Disposition to be placed by TTS after consultation.  Reassessed patient on multiple occasions, he remains responsive to verbal stimuli however is still very lethargic.  TTS consultation has been arranged however they are waiting for this patient to return to his baseline mentation prior to consultation.  At time of handover, this patient is still awaiting to return to his mental status baseline.  Also waiting for TTS consultation once he achieves baseline mental status.    Myriam Dorn BROCKS, PA 04/02/24 2347    Gennaro Duwaine CROME, DO 04/07/24 1036

## 2024-04-03 ENCOUNTER — Other Ambulatory Visit: Payer: Self-pay

## 2024-04-03 ENCOUNTER — Encounter (HOSPITAL_COMMUNITY): Payer: Self-pay | Admitting: Psychiatry

## 2024-04-03 DIAGNOSIS — F1410 Cocaine abuse, uncomplicated: Secondary | ICD-10-CM | POA: Diagnosis not present

## 2024-04-03 DIAGNOSIS — F1210 Cannabis abuse, uncomplicated: Secondary | ICD-10-CM | POA: Diagnosis not present

## 2024-04-03 DIAGNOSIS — F19959 Other psychoactive substance use, unspecified with psychoactive substance-induced psychotic disorder, unspecified: Secondary | ICD-10-CM | POA: Diagnosis present

## 2024-04-03 DIAGNOSIS — F1510 Other stimulant abuse, uncomplicated: Secondary | ICD-10-CM | POA: Diagnosis not present

## 2024-04-03 LAB — URINALYSIS, ROUTINE W REFLEX MICROSCOPIC
Bacteria, UA: NONE SEEN
Bilirubin Urine: NEGATIVE
Glucose, UA: NEGATIVE mg/dL
Hgb urine dipstick: NEGATIVE
Ketones, ur: NEGATIVE mg/dL
Leukocytes,Ua: NEGATIVE
Nitrite: NEGATIVE
Protein, ur: 30 mg/dL — AB
Specific Gravity, Urine: 1.02 (ref 1.005–1.030)
pH: 5 (ref 5.0–8.0)

## 2024-04-03 LAB — RAPID URINE DRUG SCREEN, HOSP PERFORMED
Amphetamines: POSITIVE — AB
Barbiturates: NOT DETECTED
Benzodiazepines: POSITIVE — AB
Cocaine: POSITIVE — AB
Opiates: NOT DETECTED
Tetrahydrocannabinol: NOT DETECTED

## 2024-04-03 MED ORDER — NICOTINE 14 MG/24HR TD PT24: 14.0000 mg | MEDICATED_PATCH | Freq: Every day | TRANSDERMAL | Status: DC

## 2024-04-03 MED ORDER — RISPERIDONE 1 MG PO TABS
1.0000 mg | ORAL_TABLET | Freq: Two times a day (BID) | ORAL | Status: DC
  Administered 2024-04-03 – 2024-04-05 (×5): 1 mg via ORAL
  Filled 2024-04-03 (×5): qty 1

## 2024-04-03 NOTE — ED Notes (Signed)
 Received inbound call from pt's mother requesting an update. RN explained that we are unable to share much information about the pt because she is not listed in his chart. RN explained IVC process and invited her to call pt's other family members who are listed as contacts in his chart and see if they can give her more information.

## 2024-04-03 NOTE — Consult Note (Addendum)
 [facility] Psychiatric Consult Initial  Patient Name: .Corey Rivas  MRN: [id number]  DOB: 06-14-1991  Consult Order details:  Orders (From admission, onward)     Start     Ordered   04/02/24 1754  CONSULT TO CALL ACT TEAM       Ordering Provider: Myriam Dorn BROCKS, PA  Provider:  (Not yet assigned)  Question:  Reason for Consult?  Answer:  paranoia, HI   04/02/24 1754             Mode of Visit: In person    Psychiatry Consult Evaluation  Service Date: April 03, 2024 LOS:  LOS: 0 days  Chief Complaint: they're trying to kill me, it's crazy  Primary Psychiatric Diagnoses  Psychoactive substance-induced psychosis (HCC) 2.   Methamphetamine abuse (HCC) 3.   Cannabis abuse 4.   Cocaine abuse (HCC)  Assessment   Corey Rivas, a 33 year old African American male with a self-reported childhood history of ADHD and no medical comorbidities, was brought in by law enforcement after being found in the community exhibiting erratic behavior and severe paranoia. On arrival, he continued to present with pronounced paranoia, endorsing recent methamphetamine and cannabis use and expressing delusional beliefs that people have been trying to kill him. Psychiatry was consulted for specialized evaluation and management. He is currently under involuntary commitment and has been deemed medically stable by the emergency department team.  Upon evaluation, patient presents with symptomology that is most consistent with a psychoactive substance induced psychosis, in the context of objective and subjective evidence of methamphetamine, cannabis, and cocaine abuse.  Evidence of this is further appreciable from evaluation conducted, where patient presents with severe delusional themes of paranoia around individuals being after him and trying to kill him, in the context of endorsed recent methamphetamine. Cocaine, and cannabis use.  Given the patient's continued delusional themes of paranoia around  individuals seeking to kill him, and expressions of homicidal ideations with plan to borrow a gun from his grandfather's house here in [place] to kill the individuals after him, recommendation is for inpatient mental health hospitalization, for safety and stabilization of the patient. Will begin antipsychotic psychopharmacological intervention at this time, in hopes of stabilizing the patient.  Diagnoses:  Active Hospital problems: Principal Problem:   Psychoactive substance-induced psychosis (HCC) Active Problems:   Methamphetamine abuse (HCC)   Cannabis abuse   Cocaine abuse (HCC)    Plan   # Psychoactive substance induced psychosis # Methamphetamine abuse # Cannabis abuse # Cocaine abuse  ## Psychiatric Medication Recommendations:   - Recommend Risperdal  1 mg p.o. twice daily  ## Medical Decision Making Capacity: Not specifically addressed in this encounter  ## Further Work-up: Repeat EKG for QTc monitoring; recent EKG QTC appreciably 455  ## Disposition:--Recommend inpatient mental health hospitalization under involuntary commitment  ## Behavioral / Environmental: -Strict agitation/safety precautions    ## Safety and Observation Level:  - Based on my clinical evaluation, I estimate the patient to be at moderate risk of self harm in the current setting. - At this time, we recommend  1:1 Observation. This decision is based on my review of the chart including patient's history and current presentation, interview of the patient, mental status examination, and consideration of suicide risk including evaluating suicidal ideation, plan, intent, suicidal or self-harm behaviors, risk factors, and protective factors. This judgment is based on our ability to directly address suicide risk, implement suicide prevention strategies, and develop a safety plan while the patient is in the clinical setting.  Please contact our team if there is a concern that risk level has changed.  CSSR  Risk Category:C-SSRS RISK CATEGORY: No Risk  Suicide Risk Assessment: Patient has following modifiable risk factors for suicide: access to guns, recklessness, lack of access to outpatient mental health resources, active mental illness (to encompass adhd, tbi, mania, psychosis, trauma reaction), current symptoms: anxiety/panic, insomnia, impulsivity, anhedonia, hopelessness, and triggering events, which we are addressing by treatment recommendations. Patient has following non-modifiable or demographic risk factors for suicide: male gender Patient has the following protective factors against suicide: no history of suicide attempts and no history of NSSIB  Thank you for this consult request. Recommendations have been communicated to the primary team.  We continue to follow at this time.   Corey JINNY Gravely, NP       History of Present Illness   Corey Rivas, a 33 year old African American male with a self-reported childhood history of ADHD and no medical comorbidities, was brought in by law enforcement after being found in the community exhibiting erratic behavior and severe paranoia. On arrival, he continued to present with pronounced paranoia, endorsing recent methamphetamine and cannabis use and expressing delusional beliefs that people have been trying to kill him. Psychiatry was consulted for specialized evaluation and management. He is currently under involuntary commitment and has been deemed medically stable by the emergency department team.  Patient seen today at the [facility] emergency department for face-to-face psychiatric evaluation.  Upon evaluation, patient engagement largely characterized by paranoid interpersonal style, perseveration around delusional themes of paranoia, and frequent endorsements of homicidal ideations with plan and intent, with limited ability to be directed towards answering a large portion of history and evaluation questions, though some answers are able to be  obtained, with lots of redirection .  Patient endorses that he was brought in by police, because while in the process of running away frantically from individuals he states are trying to kill him, police saw that he was in distress and needed help, so they brought him to the hospital, but when he got here, states that, they wanted to put me in a glass room where they could see me and get to me, and I kept telling them that I needed to be hidden away somewhere that did not have glass walls, but no one kept trying to hear me, so I just kept panicking, then they tied me up, and then I do not remember much after that, but I am not crazy, they are trying to kill me, it is crazy .  Prompted to expand on who they are, who are allegedly trying to kill him, patient endorses that for the last several months, his crazy ex-girlfriend has been trying to have him killed with her connections to a local gang in [place], but when prompted to articulate why this might be the case, patient endorses that, I do not know, but I linked up with my grandpa who can corroborate everything, and have already been in talks with borrowing his gun to go handle business with these guys she keeps sending after me.  Patient formally asked about homicidal ideations, given the patient's endorsements of needing to go handle business, to which patient endorses that he has plan and intent to borrow his grandfathers gun to go kill these individuals that are following him constantly and trying to kill him, before they kill me.  Redirected to discuss endorsements given this encounter to EDP team about recent methamphetamine and cannabis use, patient endorses that,  well, I live in my Toyota truck, because I've been homeless since about the last 3 months these individuals have been trying to chase me down, but to answer your question, I normally do cocaine, but I guess I would say that the other night because I could not find any cocaine,  I did some meth and smoke some weed, but that does not have anything to do with any of this, you are not understanding, I need you to listen to me, these individuals are trying to kill me, and I just need someone to speak to me about this and understand that I need help.   Patient prompted to discuss illicit substance abuse further, but outside of endorsing he has only been doing cocaine for about a month and a half, giving endorsements that he does not use EtOH regularly, and that he smokes tobacco daily, is unable to give further insight into illicit substance use. UDS appreciably positive for cocaine, benzodiazepines, and amphetamines; BAL unremarkable.  Patient endorses a frightened and anxious mood, with a paranoid interpersonal style, constricted affect, and variable to brief eye contact.  Patient endorses no suicidal ideations.  Patient endorses no auditory and or visual hallucinations, and objectively, does not appear to be responding to internal and/or external stimuli.  Patient orientation is intact, no concerns or fluctuations in consciousness. Patient endorses he has not been sleeping very well since being homeless over the last 3 months, due to he states these individuals trying to kill him, who he alleges are sent by his ex-girlfriend.  Patient endorses strong appetite, states that because he has been homeless and, on the run from individuals try to kill him, does not eat very often.  Patient endorses no mental health history, states, I am not crazy.  Patient endorses no history of suicide attempts and/or self-injurious behavior.  Patient endorses no inpatient, outpatient, medications, and/or therapy utilization, as it relates to mental health.   Patient redirected and given recommendations for inpatient mental health hospitalization, and it was discussed starting medications to treat him, to which patient had no questions or concerns after discussion, just states oddly and vaguely, all  I care about is getting this situation under control.    Discussed with patient this provider would try to contact his grandfather, to which patient verbalized he was amenable to this, and was able to give phone number to this provider.  Collateral, patient's grandfather, attempted to be contacted at [phone], but no answer, HIPAA complaint voicemail left  Review of Systems  Constitutional:  Positive for malaise/fatigue.  Musculoskeletal:  Positive for myalgias.  Psychiatric/Behavioral:  Positive for substance abuse (Meth, cocaine, cannabis). Negative for hallucinations and suicidal ideas. The patient is nervous/anxious and has insomnia.   All other systems reviewed and are negative.    Psychiatric and Social History  Psychiatric History:  Information collected from chart review/patient  Prev Dx/Sx: Self-reported ADHD Current Psych Provider: None reported or endorsed Home Meds (current): None reported or endorsed Previous Med Trials: Self-reported hx of using adderall as a child  Therapy: None reported or endorsed  Prior Psych Hospitalization: None reported or endorsed  Prior Self Harm: None reported or endorsed Prior Violence: Yes, has become violent this encounter with staff, in the context of paranoia  Family Psych History: None reported or endorsed Family Hx suicide: None reported or endorsed  Social History:  Developmental Hx: None reported or endorsed Educational Hx: None reported or endorsed Occupational Hx: Reports homelessness, does not clarify if not working  Legal Hx: None reported or endorsed Living Situation: Homeless Spiritual Hx: None reported or endorsed  Access to weapons/lethal means: Patient endorses plan and intent to borrow grandfathers firearm to kill individuals he alleges are after him  Substance History Alcohol: None reported or endorsed  Tobacco: Yes Illicit drugs: Cocaine, meth, cannabis Prescription drug abuse: None reported or endorsed   Rehab hx: None reported or endorsed   Exam Findings  Physical Exam: As below  Vital Signs:  Temp:  [97.6 F (36.4 C)-98.4 F (36.9 C)] 98.4 F (36.9 C) (07/04 0928) Pulse Rate:  [71-87] 87 (07/04 0928) Resp:  [16-20] 18 (07/04 0928) BP: (101-120)/(59-76) 120/76 (07/04 0928) SpO2:  [99 %] 99 % (07/04 0928) Blood pressure 120/76, pulse 87, temperature 98.4 F (36.9 C), temperature source Oral, resp. rate 18, SpO2 99%. There is no height or weight on file to calculate BMI.  Physical Exam Vitals and nursing note reviewed.  Constitutional:      General: He is not in acute distress.    Appearance: He is not ill-appearing, toxic-appearing or diaphoretic.     Comments: Paranoid interpersonal style  Pulmonary:     Effort: Pulmonary effort is normal.  Skin:    General: Skin is warm and dry.  Neurological:     Mental Status: He is oriented to person, place, and time.     Motor: No tremor or seizure activity.  Psychiatric:        Attention and Perception: Perception normal. He is inattentive. He does not perceive auditory or visual hallucinations.        Mood and Affect: Mood is anxious.        Behavior: Behavior is agitated. Behavior is cooperative.        Thought Content: Thought content is paranoid and delusional. Thought content includes homicidal ideation. Thought content does not include suicidal ideation. Thought content includes homicidal plan.        Judgment: Judgment is impulsive and inappropriate.     Comments: Affect: Constricted Speech: increased amount      Mental Status Exam: General Appearance: Disheveled and malodorous African-American male with severe paranoid interpersonal style  Orientation:  Full (Time, Place, and Person)  Memory:  Variable  Concentration:  Concentration: Poor and Attention Span: Poor  Recall: Variable  Attention: Inattentive  Eye Contact:  Variable to brief  Speech:  Increased amount  Language:  Fair  Volume:  Normal  Mood: Anxious and  frightened  Affect:  Constricted  Thought Process: Superficially linear to circumstantial to tangential; overall and of the majority, with perseveration around delusional themes of paranoia   Thought Content:  Delusions and Paranoid Ideation  Suicidal Thoughts:  No  Homicidal Thoughts:  Yes.  with intent/plan  Judgement:  Impaired  Insight:  Lacking  Psychomotor Activity:  Increased  Akathisia:  No  Fund of Knowledge:  Poor      Assets:  Communication Skills Desire for Improvement Physical Health  Cognition: Acutely impaired  ADL's:  Intact  AIMS (if indicated):   0     Other History   These have been pulled in through the EMR, reviewed, and updated if appropriate.  Family History:  The patient's family history includes CAD in an other family member; Cancer in an other family member; Diabetes in an other family member; Stroke in an other family member.  Medical History: History reviewed. No pertinent past medical history.  Surgical History: History reviewed. No pertinent surgical history.   Medications:   Current Facility-Administered Medications:  .  sodium chloride  0.9 % bolus 1,000 mL, 1,000 mL, Intravenous, Once, Pamella Ozell LABOR, DO No current outpatient medications on file.  Allergies: No Known Allergies  Corey JINNY Gravely, NP

## 2024-04-03 NOTE — ED Notes (Signed)
 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 He asked RN to contact whoever needed to be contacted to make sure he can be on this side of the filming because he doesn't like being part of the audience. He emphasized that he will do whatever we want in order to be included. RN explained that this is not a movie and he has no PRN medications ordered to help him relax. Pt is currently calm and cooperative.

## 2024-04-03 NOTE — ED Provider Notes (Signed)
 Emergency Medicine Observation Re-evaluation Note  Corey Rivas is a 33 y.o. male, seen on rounds today.  Pt initially presented to the ED for complaints of Near Syncope Currently, the patient is sleeping  Physical Exam  BP (!) 101/59   Pulse 71   Temp 97.6 F (36.4 C) (Axillary)   Resp 20   SpO2 98%  Physical Exam General: NAD Cardiac: Regular HR Lungs: No resp distress \  ED Course / MDM  EKG:EKG Interpretation Date/Time:  Thursday April 02 2024 10:04:00 EDT Ventricular Rate:  146 PR Interval:  130 QRS Duration:  84 QT Interval:  292 QTC Calculation: 455 R Axis:   82  Text Interpretation: Sinus tachycardia Nonspecific ST abnormality Abnormal ECG When compared with ECG of 11-Jan-2014 12:07, PREVIOUS ECG IS PRESENT Confirmed by Bari Flank [phone]) on 04/02/2024 12:20:31 PM  I have reviewed the labs performed to date as well as medications administered while in observation.   Plan  Current plan is for TTS eval Pt on 1 to 1 monitoring Last sedatives given yesterday Pt under IVC Appreciate medical recommendations for TTS team    Corey [name], MD 04/03/24 [phone]

## 2024-04-03 NOTE — ED Notes (Addendum)
 Pt used phone to speak with mother. Pt verbalized approval to have mother discuss hospital visit.  Pt asked if he can have his grandmother sign him out. RN explained he is IVC and only the magistrate can rescind IVC. Pt states he is no longer having paranoia and denies thoughts of being harmed. RN reached out to Jerel Gravely NP  to speak with pt.

## 2024-04-03 NOTE — Progress Notes (Signed)
 Inpatient Psychiatric Referral  Patient was recommended inpatient per [name] Patient NP . There are no available beds at [facility], per [facility] Bretta Qua, RN . Patient was referred to the following out of network facilities:  Destination  Service Provider Request Status Address Phone Fax  [facility]  Pending - Request Sent [address] [place] [address] [phone]  [facility]  Pending - Request Sent [address] [place] [address] [phone]  [facility]  Pending - Request Sent [address] [place] [address] [phone]  [facility]  Pending - Request Sent [address] [place] [address] [phone]  [facility]  Pending - Request Sent [address] [place] [address] [phone]  [facility]  Pending - Request Sent [address] [place] [address] [phone]  [facility]  Pending - Request Sent [address] [place] [phone]  [facility]  Pending - Request Sent [address] [place] [address] [phone]  [facility]  Pending - Request [facility] [address] [place] [address] [phone]  [facility]  Pending - Request Sent [address] [place] [address] [phone]  [facility]  Pending - Request Sent [address] [place], [place] [address] [phone]  [facility]  Pending - Request Sent [address] [place] [address] [phone]    Situation ongoing, CSW to continue following and update chart as more information becomes available.   Harrie Sofia MSW, LCSWA 04/03/2024  7:41PM

## 2024-04-03 NOTE — ED Notes (Signed)
 Please update mother Selena [phone]

## 2024-04-03 NOTE — Progress Notes (Signed)
 LCSW Progress Note  [id number]   Corey Rivas  04/03/2024  9:16 AM  Description:   Inpatient Psychiatric Referral  Patient was recommended inpatient per [name] Patient NP. There are [facility] available beds at [facility], per [facility] Bretta Qua RN. Patient was referred to the following out of network facilities:     [facility] Provider Address Phone Fax  [facility]  [address], [place] [address] [phone]  [facility]  [address] [place] [address] [phone]  [facility]  [address] [place], [place] [address] [phone]  [facility]  [address] [place] [address] [phone]  [facility]  [address] [place] [address] [phone]  [facility]  [address], [place] [address] [phone]  [facility] EFA[facility]  [address] [place], [place] [phone]  [facility]  [address] [place] [address] [phone]  [facility]  [address] [place] [address] [phone]      Situation ongoing, CSW to continue following and update chart as more information becomes available.      [name], MSW, LCSW  04/03/2024 9:16 AM

## 2024-04-04 MED ORDER — TRAZODONE HCL 50 MG PO TABS
50.0000 mg | ORAL_TABLET | Freq: Every day | ORAL | Status: DC
  Filled 2024-04-04: qty 1

## 2024-04-04 MED ORDER — LORAZEPAM 2 MG/ML IJ SOLN: 2.0000 mg | Freq: Once | INTRAMUSCULAR | Status: DC

## 2024-04-04 MED ORDER — TRAZODONE HCL 50 MG PO TABS
50.0000 mg | ORAL_TABLET | Freq: Once | ORAL | Status: AC
  Administered 2024-04-04: 50 mg via ORAL
  Filled 2024-04-04: qty 1

## 2024-04-04 NOTE — Progress Notes (Signed)
 Patient has been denied by [facility] due to no appropriate beds available. Patient meets BH inpatient criteria per [name] Patient, NP. Patient has been faxed out to the following facilities:   [facility]  [address] [place] [address] [phone]  [facility]  [address] [place] [address] [phone]  [facility]  [address] [place], [place] [address] [phone]  [facility]  [address] [place] [address] [phone]  [facility]  [address], [place] [address] [phone]  [facility]  [address], [place] [address] [phone]  [facility] EFAX  [address] [place], [place] [phone]  [facility]  [address] [place] [address] [phone]  [facility]  [address], [place] [address] [phone]  [facility]  [address] [place] [address] [phone]  [facility]  [address] [place], [place] [address] [phone]  [facility]  [address] [place] [address] [phone]  [facility]  [address] [place], [place] [address] [phone]  [facility] Patient Placement  [facility], [place] [phone]  [facility]  [address] [place] [address] [phone]  [facility]  [address] [place] [address] [phone]   Bunnie Gallop, MSW, LCSW-A  11:18 AM 04/04/2024

## 2024-04-04 NOTE — Progress Notes (Signed)
 CSW sent additional [facility] referral for review to [facility] for continued review. CSW will continue to monitor the referral to sent recommended disposition.    Katherleen Folkes, MSW, LCSW-A  2:00 PM 04/04/2024

## 2024-04-04 NOTE — ED Notes (Signed)
 Pt is becoming increasingly agitated and is convinced that one of our safety sitters has a gun and intends to harm him. RN, multiple techs, and all ED security staff as well as [employer] officer are attempting to deescalate with limited success. Pt has not been aggressive or violent but is not cooperating. Notified MD. Will continue to monitor. Numerous security staff remain present to ensure safety of ED staff.

## 2024-04-04 NOTE — ED Notes (Signed)
 IVC is current

## 2024-04-04 NOTE — ED Provider Notes (Signed)
 Emergency Medicine Observation Re-evaluation Note  Daeshon Grammatico is a 33 y.o. male, seen on rounds today.  Pt initially presented to the ED for complaints of Near Syncope Currently, the patient is asleep.  Pt was brought in by police on 7/3 for severe paranoia.  He was seen by psych and inpatient psych was recommended.    Physical Exam  BP (!) 120/91 (BP Location: Right Arm)   Pulse (!) 128   Temp 98.9 F (37.2 C) (Oral)   Resp 16   SpO2 99%  Physical Exam General: asleep Cardiac: rr Lungs: clear Psych: asleep  ED Course / MDM  EKG:EKG Interpretation Date/Time:  Thursday April 02 2024 10:04:00 EDT Ventricular Rate:  146 PR Interval:  130 QRS Duration:  84 QT Interval:  292 QTC Calculation: 455 R Axis:   82  Text Interpretation: Sinus tachycardia Nonspecific ST abnormality Abnormal ECG When compared with ECG of 11-Jan-2014 12:07, PREVIOUS ECG IS PRESENT Confirmed by Bari Flank [phone]) on 04/02/2024 12:20:31 PM  I have reviewed the labs performed to date as well as medications administered while in observation.  Recent changes in the last 24 hours include psych eval.  Plan  Current plan is for inpatient placement.    Dean Clarity, MD 04/04/24 [phone]

## 2024-04-04 NOTE — ED Notes (Signed)
Patient calm and cooperative at this time.  °

## 2024-04-05 NOTE — ED Provider Notes (Addendum)
 Emergency Medicine Observation Re-evaluation Note  Diquan Kassis is a 33 y.o. male, seen on rounds today.  Pt initially presented to the ED for complaints of Near Syncope Currently, the patient is resting in bed.  Physical Exam  BP 121/64   Pulse 88   Temp 98 F (36.7 C) (Oral)   Resp 18   SpO2 97%  Physical Exam General: Resting in bed Cardiac: Extremities well-perfused Lungs: Breathing is unlabored Psych: No agitation  ED Course / MDM  EKG:EKG Interpretation Date/Time:  Thursday April 02 2024 10:04:00 EDT Ventricular Rate:  146 PR Interval:  130 QRS Duration:  84 QT Interval:  292 QTC Calculation: 455 R Axis:   82  Text Interpretation: Sinus tachycardia Nonspecific ST abnormality Abnormal ECG When compared with ECG of 11-Jan-2014 12:07, PREVIOUS ECG IS PRESENT Confirmed by Bari Flank [phone]) on 04/02/2024 12:20:31 PM  I have reviewed the labs performed to date as well as medications administered while in observation.  Recent changes in the last 24 hours include none.  Plan  Initial plans for inpatient psychiatric admission.  Today, psychiatry reevaluated and patient is doing much better.  He has his mother at bedside.  Mother does feel comfortable with bringing patient home.  Behavioral change likely substance-induced.  Patient was discharged in stable condition.    Melvenia Motto, MD 04/05/24 1110    Melvenia Motto, MD 04/05/24 [phone]

## 2024-04-05 NOTE — Progress Notes (Signed)
 Patient has been denied by [facility] due to no appropriate beds available. Patient meets BH inpatient criteria per [name] Patient, NP. Patient has been faxed out to the following facilities:    [facility]  [address], [place] [address] [phone]  [facility]  [address] [place] [address] [phone]  [facility]  [address] [place], [place] [address] [phone]  [facility]  [address] [place] [address] [phone]  [facility]  [address], [place] [address] [phone]  [facility]  [address] [place] [address] [phone]  [facility]  [address] [place], [place] [phone]  [facility]  [address] [place] [address] [phone]  [facility]  [address] [place] [address] [phone]  [facility]  [address] [place] [address] [phone]  [facility]  [address] [place], [place] [address] [phone]  [facility]  [address], [place] [address] [phone]  [facility]  [address] [place], [place] [address] [phone]  [facility] Patient Placement  [facility] [place] [phone]  [facility]  [address] [place] [address] [phone]  [facility]  [address] [place] [address] [phone]   Bunnie Gallop, MSW, LCSW-A  10:06 AM 04/05/2024

## 2024-04-05 NOTE — ED Notes (Signed)
 No sitter at this time, staffing aware, will reach out to ED charge nurse

## 2024-04-05 NOTE — Discharge Instructions (Signed)
Avoid drug use.  Return to the emergency department for any new or worsening symptoms of concern.
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
## 2024-04-05 NOTE — ED Notes (Signed)
 IVC rescinded document has been uploaded and efiled envelope [id number]

## 2024-04-05 NOTE — Progress Notes (Signed)
 CSW sent additional BH referral information to [facility]. Patient has been accepted pending a negative COVID. CSW has informed ED staff regarding acceptance.    Bunnie Gallop, MSW, LCSW-A  10:49 AM 04/05/2024

## 2024-04-05 NOTE — ED Notes (Signed)
IVC CURRENT

## 2024-04-25 ENCOUNTER — Other Ambulatory Visit: Payer: Self-pay

## 2024-04-25 ENCOUNTER — Emergency Department (HOSPITAL_COMMUNITY): Payer: Self-pay

## 2024-04-25 ENCOUNTER — Emergency Department (HOSPITAL_COMMUNITY)
Admission: EM | Admit: 2024-04-25 | Discharge: 2024-04-25 | Disposition: A | Discharge status: Home / Self Care | Payer: Self-pay | Attending: Emergency Medicine | Admitting: Emergency Medicine

## 2024-04-25 DIAGNOSIS — L0390 Cellulitis, unspecified: Secondary | ICD-10-CM

## 2024-04-25 DIAGNOSIS — L03012 Cellulitis of left finger: Secondary | ICD-10-CM | POA: Insufficient documentation

## 2024-04-25 MED ORDER — HYDROCODONE-ACETAMINOPHEN 5-325 MG PO TABS
1.0000 | ORAL_TABLET | Freq: Once | ORAL | Status: AC
  Administered 2024-04-25: 1 via ORAL
  Filled 2024-04-25: qty 1

## 2024-04-25 MED ORDER — DOXYCYCLINE HYCLATE 100 MG PO CAPS
100.0000 mg | ORAL_CAPSULE | Freq: Two times a day (BID) | ORAL | 0 refills | Status: AC
Start: 2024-04-25 — End: ?

## 2024-04-25 MED ORDER — DOXYCYCLINE HYCLATE 100 MG PO TABS
100.0000 mg | ORAL_TABLET | Freq: Once | ORAL | Status: AC
Start: 2024-04-25 — End: 2024-04-25
  Administered 2024-04-25: 100 mg via ORAL
  Filled 2024-04-25: qty 1
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
## 2024-04-25 NOTE — Discharge Instructions (Signed)
 Today you were seen for cellulitis of your left ring finger.  Please pick up your antibiotic and take as prescribed.  Please return to the ED if you have worsening pain, uncontrollable vomiting, or fever that does not go down with Tylenol  or Motrin .  You may alternate Tylenol  and Motrin  as needed for pain and swelling.  Thank you for letting us  treat you today. After reviewing your imaging, I feel you are safe to go home. Please follow up with your PCP in the next several days and provide them with your records from this visit. Return to the Emergency Room if pain becomes severe or symptoms worsen.

## 2024-04-25 NOTE — ED Triage Notes (Addendum)
 Pt to ED c/o left ring finger problem, reports injury to finger,  hit finger with measuring tape 1 week ago and now finger is swollen . Open sore noted to finger.

## 2024-05-18 ENCOUNTER — Encounter (HOSPITAL_COMMUNITY): Payer: Self-pay

## 2024-05-18 ENCOUNTER — Other Ambulatory Visit: Payer: Self-pay

## 2024-05-18 ENCOUNTER — Emergency Department (HOSPITAL_COMMUNITY)
Admission: EM | Admit: 2024-05-18 | Discharge: 2024-05-18 | Disposition: A | Discharge status: Home / Self Care | Payer: Self-pay | Attending: Emergency Medicine | Admitting: Emergency Medicine

## 2024-05-18 DIAGNOSIS — R000 Tachycardia, unspecified: Secondary | ICD-10-CM | POA: Insufficient documentation

## 2024-05-18 DIAGNOSIS — T50905A Adverse effect of unspecified drugs, medicaments and biological substances, initial encounter: Secondary | ICD-10-CM | POA: Insufficient documentation

## 2024-05-18 LAB — COMPREHENSIVE METABOLIC PANEL WITH GFR
ALT: 19 U/L (ref 0–44)
AST: 22 U/L (ref 15–41)
Albumin: 4.7 g/dL (ref 3.5–5.0)
Alkaline Phosphatase: 57 U/L (ref 38–126)
Anion gap: 10 (ref 5–15)
BUN: 8 mg/dL (ref 6–20)
CO2: 24 mmol/L (ref 22–32)
Calcium: 9.5 mg/dL (ref 8.9–10.3)
Chloride: 102 mmol/L (ref 98–111)
Creatinine, Ser: 1.16 mg/dL (ref 0.61–1.24)
GFR, Estimated: >60 mL/min (ref >60)
Glucose, Bld: 134 mg/dL — ABNORMAL HIGH (ref 70–99)
Potassium: 3.1 mmol/L — ABNORMAL LOW (ref 3.5–5.1)
Sodium: 136 mmol/L (ref 135–145)
Total Bilirubin: 0.6 mg/dL (ref 0.0–1.2)
Total Protein: 8.4 g/dL — ABNORMAL HIGH (ref 6.5–8.1)

## 2024-05-18 LAB — CBC WITH DIFFERENTIAL/PLATELET
Abs Immature Granulocytes: 0.02 K/uL (ref 0.00–0.07)
Basophils Absolute: 0.1 K/uL (ref 0.0–0.1)
Basophils Relative: 1 %
Eosinophils Absolute: 0.3 K/uL (ref 0.0–0.5)
Eosinophils Relative: 4 %
HCT: 44.9 % (ref 39.0–52.0)
Hemoglobin: 15 g/dL (ref 13.0–17.0)
Immature Granulocytes: 0 %
Lymphocytes Relative: 29 %
Lymphs Abs: 2.6 K/uL (ref 0.7–4.0)
MCH: 30.1 pg (ref 26.0–34.0)
MCHC: 33.4 g/dL (ref 30.0–36.0)
MCV: 90.2 fL (ref 80.0–100.0)
Monocytes Absolute: 0.7 K/uL (ref 0.1–1.0)
Monocytes Relative: 8 %
Neutro Abs: 5.1 K/uL (ref 1.7–7.7)
Neutrophils Relative %: 58 %
Platelets: 251 K/uL (ref 150–400)
RBC: 4.98 MIL/uL (ref 4.22–5.81)
RDW: 13 % (ref 11.5–15.5)
WBC: 8.7 K/uL (ref 4.0–10.5)
nRBC: 0 % (ref 0.0–0.2)

## 2024-05-18 LAB — RAPID URINE DRUG SCREEN, HOSP PERFORMED
Amphetamines: POSITIVE — AB
Barbiturates: NOT DETECTED
Benzodiazepines: NOT DETECTED
Cocaine: POSITIVE — AB
Opiates: NOT DETECTED
Tetrahydrocannabinol: NOT DETECTED

## 2024-05-18 MED ORDER — POTASSIUM CHLORIDE CRYS ER 20 MEQ PO TBCR
40.0000 meq | EXTENDED_RELEASE_TABLET | Freq: Once | ORAL | Status: AC
  Administered 2024-05-18: 40 meq via ORAL
  Filled 2024-05-18: qty 2

## 2024-05-18 MED ORDER — SODIUM CHLORIDE 0.9 % IV BOLUS
1000.0000 mL | INTRAVENOUS | Status: AC
  Administered 2024-05-18: 1000 mL via INTRAVENOUS

## 2024-05-18 MED ORDER — HALOPERIDOL LACTATE 5 MG/ML IJ SOLN
5.0000 mg | Freq: Once | INTRAMUSCULAR | Status: AC
  Administered 2024-05-18: 5 mg via INTRAVENOUS
  Filled 2024-05-18: qty 1
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
## 2024-05-18 NOTE — ED Triage Notes (Signed)
 BIB EMS from [facility] for tachycardia. Pt denies any sx, HR sustained at 120 with EMS. Hx of meth use, pt denies using today. Denies pain.

## 2024-07-08 ENCOUNTER — Emergency Department (HOSPITAL_COMMUNITY): Payer: Self-pay

## 2024-07-08 ENCOUNTER — Emergency Department (HOSPITAL_COMMUNITY)
Admission: EM | Admit: 2024-07-08 | Discharge: 2024-07-08 | Disposition: A | Discharge status: Home / Self Care | Payer: Self-pay | Attending: Emergency Medicine | Admitting: Emergency Medicine

## 2024-07-08 ENCOUNTER — Encounter (HOSPITAL_COMMUNITY): Payer: Self-pay

## 2024-07-08 ENCOUNTER — Other Ambulatory Visit: Payer: Self-pay

## 2024-07-08 DIAGNOSIS — R059 Cough, unspecified: Secondary | ICD-10-CM | POA: Insufficient documentation

## 2024-07-08 DIAGNOSIS — R42 Dizziness and giddiness: Secondary | ICD-10-CM | POA: Insufficient documentation

## 2024-07-08 DIAGNOSIS — R0602 Shortness of breath: Secondary | ICD-10-CM | POA: Insufficient documentation

## 2024-07-08 DIAGNOSIS — F17210 Nicotine dependence, cigarettes, uncomplicated: Secondary | ICD-10-CM | POA: Insufficient documentation

## 2024-07-08 DIAGNOSIS — R000 Tachycardia, unspecified: Secondary | ICD-10-CM | POA: Insufficient documentation

## 2024-07-08 DIAGNOSIS — R0981 Nasal congestion: Secondary | ICD-10-CM | POA: Insufficient documentation

## 2024-07-08 DIAGNOSIS — R0789 Other chest pain: Secondary | ICD-10-CM | POA: Insufficient documentation

## 2024-07-08 DIAGNOSIS — F1990 Other psychoactive substance use, unspecified, uncomplicated: Secondary | ICD-10-CM

## 2024-07-08 DIAGNOSIS — F1510 Other stimulant abuse, uncomplicated: Secondary | ICD-10-CM | POA: Insufficient documentation

## 2024-07-08 LAB — URINE DRUG SCREEN
Amphetamines: POSITIVE — AB
Barbiturates: NEGATIVE
Benzodiazepines: NEGATIVE
Cocaine: NEGATIVE
Fentanyl: NEGATIVE
Methadone Scn, Ur: NEGATIVE
Opiates: NEGATIVE
Tetrahydrocannabinol: NEGATIVE

## 2024-07-08 LAB — RESP PANEL BY RT-PCR (RSV, FLU A&B, COVID)  RVPGX2
Influenza A by PCR: NEGATIVE
Influenza B by PCR: NEGATIVE
Resp Syncytial Virus by PCR: NEGATIVE
SARS Coronavirus 2 by RT PCR: NEGATIVE

## 2024-07-08 LAB — CBC WITH DIFFERENTIAL/PLATELET
Abs Immature Granulocytes: 0.03 K/uL (ref 0.00–0.07)
Basophils Absolute: 0.1 K/uL (ref 0.0–0.1)
Basophils Relative: 0 %
Eosinophils Absolute: 0.1 K/uL (ref 0.0–0.5)
Eosinophils Relative: 1 %
HCT: 46 % (ref 39.0–52.0)
Hemoglobin: 15.3 g/dL (ref 13.0–17.0)
Immature Granulocytes: 0 %
Lymphocytes Relative: 27 %
Lymphs Abs: 3 K/uL (ref 0.7–4.0)
MCH: 29.9 pg (ref 26.0–34.0)
MCHC: 33.3 g/dL (ref 30.0–36.0)
MCV: 90 fL (ref 80.0–100.0)
Monocytes Absolute: 0.9 K/uL (ref 0.1–1.0)
Monocytes Relative: 8 %
Neutro Abs: 7.1 K/uL (ref 1.7–7.7)
Neutrophils Relative %: 64 %
Platelets: 293 K/uL (ref 150–400)
RBC: 5.11 MIL/uL (ref 4.22–5.81)
RDW: 13.2 % (ref 11.5–15.5)
WBC: 11.2 K/uL — ABNORMAL HIGH (ref 4.0–10.5)
nRBC: 0 % (ref 0.0–0.2)

## 2024-07-08 LAB — COMPREHENSIVE METABOLIC PANEL WITH GFR
ALT: 14 U/L (ref 0–44)
AST: 23 U/L (ref 15–41)
Albumin: 4.8 g/dL (ref 3.5–5.0)
Alkaline Phosphatase: 76 U/L (ref 38–126)
Anion gap: 15 (ref 5–15)
BUN: 10 mg/dL (ref 6–20)
CO2: 21 mmol/L — ABNORMAL LOW (ref 22–32)
Calcium: 10.1 mg/dL (ref 8.9–10.3)
Chloride: 100 mmol/L (ref 98–111)
Creatinine, Ser: 1.36 mg/dL — ABNORMAL HIGH (ref 0.61–1.24)
GFR, Estimated: >60 mL/min (ref >60)
Glucose, Bld: 170 mg/dL — ABNORMAL HIGH (ref 70–99)
Potassium: 3.3 mmol/L — ABNORMAL LOW (ref 3.5–5.1)
Sodium: 137 mmol/L (ref 135–145)
Total Bilirubin: 0.5 mg/dL (ref 0.0–1.2)
Total Protein: 7.9 g/dL (ref 6.5–8.1)

## 2024-07-08 LAB — TROPONIN T, HIGH SENSITIVITY
Troponin T High Sensitivity: <15 ng/L (ref 0–19)
Troponin T High Sensitivity: <15 ng/L (ref 0–19)

## 2024-07-08 LAB — TSH: TSH: 0.377 u[IU]/mL (ref 0.350–4.500)

## 2024-07-08 LAB — D-DIMER, QUANTITATIVE: D-Dimer, Quant: <0.27 ug{FEU}/mL (ref 0.00–0.50)

## 2024-07-08 LAB — T4, FREE: Free T4: 0.76 ng/dL (ref 0.61–1.12)

## 2024-07-08 LAB — MAGNESIUM: Magnesium: 2 mg/dL (ref 1.7–2.4)

## 2024-07-08 LAB — PRO BRAIN NATRIURETIC PEPTIDE: Pro Brain Natriuretic Peptide: <50 pg/mL (ref <300.0)

## 2024-07-08 MED ORDER — LACTATED RINGERS IV BOLUS
1000.0000 mL | Freq: Once | INTRAVENOUS | Status: AC
  Administered 2024-07-08: 1000 mL via INTRAVENOUS

## 2024-07-08 MED ORDER — LACTATED RINGERS IV BOLUS
1000.0000 mL | Freq: Once | INTRAVENOUS | Status: AC
Start: 2024-07-08 — End: 2024-07-08
  Administered 2024-07-08: 1000 mL via INTRAVENOUS

## 2024-07-08 MED ORDER — LORAZEPAM 2 MG/ML IJ SOLN
1.0000 mg | Freq: Once | INTRAMUSCULAR | Status: AC
  Administered 2024-07-08: 1 mg via INTRAVENOUS
  Filled 2024-07-08: qty 1

## 2024-07-08 MED ORDER — POTASSIUM CHLORIDE CRYS ER 20 MEQ PO TBCR
40.0000 meq | EXTENDED_RELEASE_TABLET | Freq: Once | ORAL | Status: AC
  Administered 2024-07-08: 40 meq via ORAL
  Filled 2024-07-08: qty 2

## 2024-07-08 MED ORDER — ONDANSETRON HCL 4 MG/2ML IJ SOLN
4.0000 mg | Freq: Once | INTRAMUSCULAR | Status: AC
  Administered 2024-07-08: 4 mg via INTRAVENOUS
  Filled 2024-07-08: qty 2

## 2024-07-08 MED ORDER — LORAZEPAM 2 MG/ML IJ SOLN
0.5000 mg | Freq: Once | INTRAMUSCULAR | Status: AC
  Administered 2024-07-08: 0.5 mg via INTRAVENOUS
  Filled 2024-07-08: qty 1

## 2024-07-08 NOTE — ED Provider Notes (Signed)
 [place] EMERGENCY DEPARTMENT AT [facility] Provider Note   CSN: [id number] Arrival date & time: 07/08/24  1820     Patient presents with: Chest Pain, Nausea, and Dizziness   Corey Rivas is a 33 y.o. male.  {Add pertinent medical, surgical, social history, OB history to HPI:32947} HPI     33yo male with history of polysubstance abuse and substance-induced psychosis, who presents with concern for chest pain, nausea, lightheadedness.   Reports that he has been feeling sick over the last week with cough, congestion and the symptoms have continued.  Today he developed left-sided chest pain that feels like a tightness.  The pain does not radiate to the back.  He denies any radiation of the pain at all, but his mom does report sometimes it looks like he holds onto his arm or his shoulder like they are also hurting.  He reports associated nausea.  He reports shortness of breath.  He feels lightheaded like he is in a pass out.  He does smoke cigarettes, reports he has had some alcohol today but does not drink every day .  Denies other drug use.     History reviewed. No pertinent past medical history.   Prior to Admission medications   Medication Sig Start Date End Date Taking? Authorizing Provider  doxycycline  (VIBRAMYCIN ) 100 MG capsule Take 1 capsule (100 mg total) by mouth 2 (two) times daily. Patient not taking: Reported on 05/18/2024 04/25/24   Francis Ileana SAILOR, PA-C    Allergies: Patient has no known allergies.    Review of Systems  Updated Vital Signs BP (!) 157/110 (BP Location: Left Arm)   Pulse (!) 155   Temp 99.3 F (37.4 C) (Oral)   Resp (!) 25   Ht 6' (1.829 m)   Wt 96.1 kg   SpO2 100%   BMI 28.73 kg/m   Physical Exam Vitals and nursing note reviewed.  Constitutional:      General: He is not in acute distress (anxious appearance).    Appearance: He is well-developed. He is not diaphoretic.  HENT:     Head: Normocephalic and atraumatic.  Eyes:      Conjunctiva/sclera: Conjunctivae normal.  Cardiovascular:     Rate and Rhythm: Regular rhythm. Tachycardia present.     Heart sounds: Normal heart sounds. No murmur heard.    No friction rub. No gallop.  Pulmonary:     Effort: Pulmonary effort is normal. No respiratory distress.     Breath sounds: Normal breath sounds. No wheezing or rales.  Abdominal:     General: There is no distension.     Palpations: Abdomen is soft.     Tenderness: There is no abdominal tenderness. There is no guarding.  Musculoskeletal:     Cervical back: Normal range of motion.  Skin:    General: Skin is warm and dry.  Neurological:     Mental Status: He is alert and oriented to person, place, and time.     (all labs ordered are listed, but only abnormal results are displayed) Labs Reviewed - No data to display  EKG: EKG Interpretation Date/Time:  Wednesday July 08 2024 18:25:19 EDT Ventricular Rate:  144 PR Interval:  129 QRS Duration:  86 QT Interval:  284 QTC Calculation: 440 R Axis:   78  Text Interpretation: Sinus tachycardia LAE, consider biatrial enlargement Borderline T wave abnormalities Baseline wander in lead(s) I III aVL No significant change since last tracing Confirmed by Dreama Longs ([id number]) on  07/08/2024 6:36:44 PM  Radiology: No results found.  {Document cardiac monitor, telemetry assessment procedure when appropriate:32947} Procedures   Medications Ordered in the ED - No data to display    {Click here for ABCD2, HEART and other calculators REFRESH Note before signing:1}                               33yo male with history of polysubstance abuse and substance-induced psychosis, who presents with concern for chest pain, nausea, lightheadedness.  EKG was evaluated interpreted by me and shows sinus tachycardia, similar to previous EKGs.  Chest x-ray was completed and personally abided by me and radiology and showed no evidence of pneumonia, pneumothorax, or other  abnormalities.  Labs completed and personally evaluated interpreted by me show mild leukocytosis, no anemia, no clinically significant electrolyte abnormalities.  He does have mild hypokalemia, mildly decreased bicarb.  His D-dimer is negative and have low suspicion for PE.  His proBNP is within normal limits and doubt congestive heart failure.  His troponin is within normal limits and doubt myocarditis or ACS.  TSH is within normal limits and doubt thyroid storm or thyroid emergency.  COVID, flu and RSV testing are negative as etiologies of his cough, congestion.  His UDS is positive for amphetamines which may be etiology of his tachycardia and anxiety.   He was given IV fluids, Ativan  and his heart rate which was initially 155 on arrival has now decreased to 90s.    {Document critical care time when appropriate  Document review of labs and clinical decision tools ie CHADS2VASC2, etc  Document your independent review of radiology images and any outside records  Document your discussion with family members, caretakers and with consultants  Document social determinants of health affecting pt's care  Document your decision making why or why not admission, treatments were needed:32947:::1}   Final diagnoses:  None    ED Discharge Orders     None

## 2024-07-08 NOTE — ED Notes (Signed)
 HR up to 135, MD made aware. See new orders.

## 2024-07-08 NOTE — ED Triage Notes (Signed)
 Pt presents to ED c/o chest pain on left side. Denies radiating pain to any other side. Also complains of nausea and dizziness.
